# Patient Record
Sex: Female | Born: 1990 | Race: Black or African American | Hispanic: No | Marital: Married | Smoking: Never smoker
Health system: Southern US, Community
[De-identification: ages and names within clinical notes are randomized; demographics above are authoritative.]

## PROBLEM LIST (undated history)

## (undated) DIAGNOSIS — K219 Gastro-esophageal reflux disease without esophagitis: Secondary | ICD-10-CM

## (undated) DIAGNOSIS — F32A Depression, unspecified: Secondary | ICD-10-CM

## (undated) DIAGNOSIS — F329 Major depressive disorder, single episode, unspecified: Secondary | ICD-10-CM

## (undated) HISTORY — PX: TONSILLECTOMY: SUR1361

---
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
## 2009-04-22 ENCOUNTER — Emergency Department (HOSPITAL_COMMUNITY): Admission: EM | Admit: 2009-04-22 | Discharge: 2009-04-22 | Payer: Self-pay | Admitting: Emergency Medicine

## 2009-05-29 ENCOUNTER — Emergency Department (HOSPITAL_COMMUNITY): Admission: EM | Admit: 2009-05-29 | Discharge: 2009-05-29 | Payer: Self-pay | Admitting: Emergency Medicine

## 2010-06-26 LAB — BASIC METABOLIC PANEL
BUN: 8 mg/dL (ref 6–23)
Chloride: 111 mEq/L (ref 96–112)
GFR calc Af Amer: >60 mL/min (ref >60)
Glucose, Bld: 111 mg/dL — ABNORMAL HIGH (ref 70–99)

## 2010-06-26 LAB — URINALYSIS, ROUTINE W REFLEX MICROSCOPIC
Bilirubin Urine: NEGATIVE
Glucose, UA: NEGATIVE mg/dL
Ketones, ur: 15 mg/dL — AB
Leukocytes, UA: NEGATIVE
Nitrite: NEGATIVE
pH: 5.5 (ref 5.0–8.0)

## 2010-09-06 ENCOUNTER — Emergency Department (HOSPITAL_COMMUNITY)
Admission: EM | Admit: 2010-09-06 | Discharge: 2010-09-06 | Disposition: A | Discharge status: Home / Self Care | Payer: Medicaid Other | Attending: Emergency Medicine | Admitting: Emergency Medicine

## 2010-09-06 DIAGNOSIS — S80869A Insect bite (nonvenomous), unspecified lower leg, initial encounter: Secondary | ICD-10-CM | POA: Insufficient documentation

## 2010-09-06 DIAGNOSIS — L089 Local infection of the skin and subcutaneous tissue, unspecified: Secondary | ICD-10-CM | POA: Insufficient documentation

## 2010-09-06 DIAGNOSIS — Y998 Other external cause status: Secondary | ICD-10-CM | POA: Insufficient documentation

## 2010-11-29 ENCOUNTER — Encounter: Payer: Self-pay | Admitting: *Deleted

## 2010-11-29 ENCOUNTER — Emergency Department (HOSPITAL_COMMUNITY)
Admission: EM | Admit: 2010-11-29 | Discharge: 2010-11-29 | Disposition: A | Discharge status: Home / Self Care | Payer: Medicaid Other | Attending: Emergency Medicine | Admitting: Emergency Medicine

## 2010-11-29 DIAGNOSIS — IMO0001 Reserved for inherently not codable concepts without codable children: Secondary | ICD-10-CM | POA: Insufficient documentation

## 2010-11-29 DIAGNOSIS — W57XXXA Bitten or stung by nonvenomous insect and other nonvenomous arthropods, initial encounter: Secondary | ICD-10-CM

## 2010-11-29 MED ORDER — DOXYCYCLINE HYCLATE 100 MG PO CAPS: 100.0000 mg | ORAL_CAPSULE | Freq: Two times a day (BID) | ORAL | Status: AC

## 2010-11-29 NOTE — ED Notes (Signed)
Pt reports multiple tick bites over the past week, red areas noted to left thigh, rt wrist and abd

## 2010-11-29 NOTE — ED Notes (Signed)
Pt a/ox4. Resp even and unlabored. NAD at this time. D/C instructions reviewed with pt. Pt verbalized understanding. Pt ambulated with steady gate.  

## 2010-11-29 NOTE — ED Provider Notes (Signed)
History     CSN: [id number] Arrival date & time: 11/29/2010  8:30 PM  Chief Complaint  Patient presents with  . Insect Bite   Patient is a 20 y.o. female presenting with rash. The history is provided by the patient.  Rash  This is a new problem. The current episode started more than 2 days ago. The problem has been gradually improving. Associated with: she has removed several small deer ticks from her legs and abdomen the past week,  with one persistent red and tender area on her left upper thigh. There has been no fever. The rash is present on the left upper leg. The pain is at a severity of 2/10. The pain is mild. The pain has been constant since onset. Associated symptoms include pain. Associated symptoms comments: She has redness surrounding the tick bite site. . She has tried nothing for the symptoms.    History reviewed. No pertinent past medical history.  Past Surgical History  Procedure Date  . Tonsillectomy     No family history on file.  History  Substance Use Topics  . Smoking status: Never Smoker   . Smokeless tobacco: Not on file  . Alcohol Use: No    OB History    Grav Para Term Preterm Abortions TAB SAB Ect Mult Living                  Review of Systems  Constitutional: Negative for fever, diaphoresis and fatigue.  HENT: Negative for congestion, sore throat and neck pain.   Eyes: Negative.   Respiratory: Negative for chest tightness and shortness of breath.   Cardiovascular: Negative for chest pain.  Gastrointestinal: Negative for nausea and abdominal pain.  Genitourinary: Negative.   Musculoskeletal: Negative for joint swelling and arthralgias.  Skin: Positive for color change and wound.  Neurological: Negative for dizziness, weakness, light-headedness, numbness and headaches.  Hematological: Negative.   Psychiatric/Behavioral: Negative.     Physical Exam  BP 100/61  Pulse 89  Temp(Src) 98.9 F (37.2 C) (Oral)  Resp 16  Ht 5' (1.524 m)  Wt 110  lb (49.896 kg)  BMI 21.48 kg/m2  SpO2 100%  LMP 11/22/2010  Physical Exam  Nursing note and vitals reviewed. Constitutional: She is oriented to person, place, and time. She appears well-developed and well-nourished.  HENT:  Head: Normocephalic and atraumatic.  Eyes: Conjunctivae are normal.  Neck: Normal range of motion.  Cardiovascular: Normal rate, regular rhythm, normal heart sounds and intact distal pulses.   Pulmonary/Chest: Effort normal and breath sounds normal. She has no wheezes.  Abdominal: Soft. Bowel sounds are normal. There is no tenderness.  Musculoskeletal: Normal range of motion.  Neurological: She is alert and oriented to person, place, and time.  Skin: Skin is warm and dry. Rash noted. Rash is macular.     Psychiatric: She has a normal mood and affect.    ED Course  Procedures  MDM Probable local tick bite reaction,  But with multiple tick exposures,  Will prophylax with doxycycline.      Candis Musa, PA 11/29/10 2354

## 2010-11-30 NOTE — ED Provider Notes (Signed)
History/physical exam/procedure(s) were performed by non-physician practitioner and as supervising physician I was immediately available for consultation/collaboration. I have reviewed all notes and am in agreement with care and plan.   Eulis Salazar S Gaila Engebretsen, MD 11/30/10 1553 

## 2011-01-13 LAB — DIFFERENTIAL
Basophils Absolute: 0
Basophils Relative: 0
Eosinophils Absolute: 0.1 — ABNORMAL LOW
Neutrophils Relative %: 74 — ABNORMAL HIGH

## 2011-01-13 LAB — PREGNANCY, URINE: Preg Test, Ur: NEGATIVE

## 2011-01-13 LAB — CBC
HCT: 34.4 — ABNORMAL LOW
Hemoglobin: 11.2 — ABNORMAL LOW
MCHC: 32.6
MCV: 71.7 — ABNORMAL LOW
Platelets: 106 — ABNORMAL LOW
RBC: 4.8
RDW: 15.1

## 2011-01-13 LAB — COMPREHENSIVE METABOLIC PANEL
ALT: 14
AST: 19
Albumin: 3.4 — ABNORMAL LOW
CO2: 23
Calcium: 8.7
Creatinine, Ser: 0.89
Glucose, Bld: 101 — ABNORMAL HIGH
Total Protein: 5.7 — ABNORMAL LOW

## 2011-01-13 LAB — URINALYSIS, ROUTINE W REFLEX MICROSCOPIC
Bilirubin Urine: NEGATIVE
Glucose, UA: NEGATIVE
Hgb urine dipstick: NEGATIVE
Ketones, ur: 15 — AB
Nitrite: NEGATIVE
pH: 6

## 2011-01-26 ENCOUNTER — Encounter (HOSPITAL_COMMUNITY): Payer: Self-pay | Admitting: Emergency Medicine

## 2011-01-26 ENCOUNTER — Emergency Department (HOSPITAL_COMMUNITY)
Admission: EM | Admit: 2011-01-26 | Discharge: 2011-01-26 | Disposition: A | Discharge status: Home / Self Care | Payer: Medicaid Other | Attending: Emergency Medicine | Admitting: Emergency Medicine

## 2011-01-26 DIAGNOSIS — N760 Acute vaginitis: Secondary | ICD-10-CM | POA: Insufficient documentation

## 2011-01-26 DIAGNOSIS — A499 Bacterial infection, unspecified: Secondary | ICD-10-CM | POA: Insufficient documentation

## 2011-01-26 DIAGNOSIS — B9689 Other specified bacterial agents as the cause of diseases classified elsewhere: Secondary | ICD-10-CM | POA: Insufficient documentation

## 2011-01-26 LAB — WET PREP, GENITAL: Yeast Wet Prep HPF POC: NONE SEEN

## 2011-01-26 LAB — RPR: RPR Ser Ql: NONREACTIVE

## 2011-01-26 LAB — URINALYSIS, ROUTINE W REFLEX MICROSCOPIC
Hgb urine dipstick: NEGATIVE
Protein, ur: NEGATIVE mg/dL
Specific Gravity, Urine: 1.025 (ref 1.005–1.030)
Urobilinogen, UA: 0.2 mg/dL (ref 0.0–1.0)

## 2011-01-26 LAB — PREGNANCY, URINE: Preg Test, Ur: NEGATIVE

## 2011-01-26 LAB — URINE MICROSCOPIC-ADD ON

## 2011-01-26 MED ORDER — FLUCONAZOLE 100 MG PO TABS
150.0000 mg | ORAL_TABLET | Freq: Once | ORAL | Status: AC
  Administered 2011-01-26: 150 mg via ORAL
  Filled 2011-01-26: qty 2

## 2011-01-26 MED ORDER — METRONIDAZOLE 500 MG PO TABS: 500.0000 mg | ORAL_TABLET | Freq: Two times a day (BID) | ORAL | Status: AC

## 2011-01-26 NOTE — ED Notes (Addendum)
Patient c/o vaginal discharge that started last night. Patient states "I had been on my period for a month now with my birth control and I just came off. I haven't had sex for the whole month. I just started to itch down there and now I have discharge that's so bad coming out in clumps." Patient denies odor reports discharge as thick white. Patient also reports having recent allergic reactions to birth control pills.

## 2011-01-27 LAB — GC/CHLAMYDIA PROBE AMP, GENITAL: Chlamydia, DNA Probe: NEGATIVE

## 2011-01-31 NOTE — ED Provider Notes (Signed)
History     CSN: [id number] Arrival date & time: 01/26/2011  3:22 PM   First MD Initiated Contact with Patient 01/26/11 1540      Chief Complaint  Patient presents with  . Vaginal Discharge    (Consider location/radiation/quality/duration/timing/severity/associated sxs/prior treatment) HPI Comments: She presents with thick,  White vaginal discharge and vaginal itching which started yesterday.  She does report having a 3 week long mense,  Which she states is her normal cycle since being placed on Depo provera last year.  She has not been sexually active in over 1 month.  Patient is a 20 y.o. female presenting with vaginal discharge. The history is provided by the patient.  Vaginal Discharge This is a new problem. The current episode started yesterday. The problem occurs constantly. The problem has been gradually worsening. Pertinent negatives include no abdominal pain, arthralgias, chest pain, congestion, fever, headaches, joint swelling, nausea, neck pain, numbness, rash, sore throat, swollen glands, vomiting or weakness. The symptoms are aggravated by nothing. She has tried nothing for the symptoms.    History reviewed. No pertinent past medical history.  Past Surgical History  Procedure Date  . Tonsillectomy     History reviewed. No pertinent family history.  History  Substance Use Topics  . Smoking status: Never Smoker   . Smokeless tobacco: Never Used  . Alcohol Use: No    OB History    Grav Para Term Preterm Abortions TAB SAB Ect Mult Living   1 1 1       1       Review of Systems  Constitutional: Negative for fever.  HENT: Negative for congestion, sore throat and neck pain.   Eyes: Negative.   Respiratory: Negative for chest tightness and shortness of breath.   Cardiovascular: Negative for chest pain.  Gastrointestinal: Negative for nausea, vomiting and abdominal pain.  Genitourinary: Positive for vaginal discharge. Negative for dysuria, frequency, hematuria,  flank pain, genital sores and pelvic pain.  Musculoskeletal: Negative for joint swelling and arthralgias.  Skin: Negative.  Negative for rash and wound.  Neurological: Negative for dizziness, weakness, light-headedness, numbness and headaches.  Hematological: Negative.   Psychiatric/Behavioral: Negative.     Allergies  Bee venom  Home Medications   Current Outpatient Rx  Name Route Sig Dispense Refill  . ETONOGESTREL 68 MG [place] IMPL Subcutaneous Inject 1 each into the skin once. AUGUST 2010     . METRONIDAZOLE 500 MG PO TABS Oral Take 1 tablet (500 mg total) by mouth 2 (two) times daily. 14 tablet 0    BP 97/60  Pulse 82  Temp(Src) 98.4 F (36.9 C) (Oral)  Resp 16  Ht 5\' 2"  (1.575 m)  Wt 106 lb 9.6 oz (48.353 kg)  BMI 19.50 kg/m2  SpO2 100%  LMP 01/03/2011  Physical Exam  Nursing note and vitals reviewed. Constitutional: She is oriented to person, place, and time. She appears well-developed and well-nourished.  HENT:  Head: Normocephalic and atraumatic.  Eyes: Conjunctivae are normal.  Neck: Normal range of motion.  Cardiovascular: Normal rate, regular rhythm, normal heart sounds and intact distal pulses.   Pulmonary/Chest: Effort normal and breath sounds normal. She has no wheezes.  Abdominal: Soft. Bowel sounds are normal. There is no tenderness.  Genitourinary: Uterus normal. There is no rash on the right labia. There is no rash on the left labia. Cervix exhibits no motion tenderness and no discharge. Right adnexum displays no mass and no tenderness. Left adnexum displays no mass and no tenderness.  There is erythema around the vagina. Vaginal discharge found.       Os closed.  Musculoskeletal: Normal range of motion.  Neurological: She is alert and oriented to person, place, and time.  Skin: Skin is warm and dry.  Psychiatric: She has a normal mood and affect.    ED Course  Procedures (including critical care time)   Results for orders placed during the hospital  encounter of 01/26/11  URINALYSIS, ROUTINE W REFLEX MICROSCOPIC      Component Value Range   Color, Urine YELLOW  YELLOW    Appearance HAZY (*) CLEAR    Specific Gravity, Urine 1.025  1.005 - 1.030    pH 7.0  5.0 - 8.0    Glucose, UA NEGATIVE  NEGATIVE (mg/dL)   Hgb urine dipstick NEGATIVE  NEGATIVE    Bilirubin Urine NEGATIVE  NEGATIVE    Ketones, ur NEGATIVE  NEGATIVE (mg/dL)   Protein, ur NEGATIVE  NEGATIVE (mg/dL)   Urobilinogen, UA 0.2  0.0 - 1.0 (mg/dL)   Nitrite NEGATIVE  NEGATIVE    Leukocytes, UA MODERATE (*) NEGATIVE   GC/CHLAMYDIA PROBE AMP, GENITAL      Component Value Range   GC Probe Amp, Genital NEGATIVE  NEGATIVE    Chlamydia, DNA Probe NEGATIVE  NEGATIVE   RPR      Component Value Range   RPR NON REACTIVE  NON REACTIVE   WET PREP, GENITAL      Component Value Range   Yeast, Wet Prep NONE SEEN  NONE SEEN    Trich, Wet Prep NONE SEEN  NONE SEEN    Clue Cells, Wet Prep MANY (*) NONE SEEN    WBC, Wet Prep HPF POC MANY (*) NONE SEEN   PREGNANCY, URINE      Component Value Range   Preg Test, Ur NEGATIVE    URINE MICROSCOPIC-ADD ON      Component Value Range   Squamous Epithelial / LPF FEW (*) RARE    WBC, UA 7-10  <3 (WBC/hpf)   RBC / HPF 3-6  <3 (RBC/hpf)   Bacteria, UA MANY (*) RARE    No results found.     1. Bacterial vaginosis       MDM  Suspect leuk est on UA vaginal contamination given no sx of dysuria.  Treated for bv with flagyl.  Also,  Given white,  Clumpy and copious vaginal dc,  Also covered for yeast,  As exam most consistent with yeast vaginitis.  Diflucan 150mg  PO x 1.  Flagyl prescription.  Patients labs and/or radiological studies were reviewed during the medical decision making and disposition process.         Candis Musa, PA 01/31/11 2123

## 2011-02-04 NOTE — ED Provider Notes (Signed)
Medical screening examination/treatment/procedure(s) were performed by non-physician practitioner and as supervising physician I was immediately available for consultation/collaboration.  Donnetta Hutching, MD 02/04/11 [phone]

## 2011-02-15 ENCOUNTER — Emergency Department (HOSPITAL_COMMUNITY)
Admission: EM | Admit: 2011-02-15 | Discharge: 2011-02-15 | Disposition: A | Discharge status: Home / Self Care | Payer: Medicaid Other | Attending: Emergency Medicine | Admitting: Emergency Medicine

## 2011-02-15 ENCOUNTER — Encounter (HOSPITAL_COMMUNITY): Payer: Self-pay | Admitting: Emergency Medicine

## 2011-02-15 DIAGNOSIS — R51 Headache: Secondary | ICD-10-CM | POA: Insufficient documentation

## 2011-02-15 DIAGNOSIS — R112 Nausea with vomiting, unspecified: Secondary | ICD-10-CM | POA: Insufficient documentation

## 2011-02-15 LAB — CBC
HCT: 35.3 % — ABNORMAL LOW (ref 36.0–46.0)
Hemoglobin: 11.5 g/dL — ABNORMAL LOW (ref 12.0–15.0)
MCH: 23.3 pg — ABNORMAL LOW (ref 26.0–34.0)
MCHC: 32.6 g/dL (ref 30.0–36.0)
MCV: 71.6 fL — ABNORMAL LOW (ref 78.0–100.0)
RBC: 4.93 MIL/uL (ref 3.87–5.11)

## 2011-02-15 LAB — URINALYSIS, ROUTINE W REFLEX MICROSCOPIC
Bilirubin Urine: NEGATIVE
Ketones, ur: NEGATIVE mg/dL
Nitrite: NEGATIVE
Specific Gravity, Urine: 1.025 (ref 1.005–1.030)
Urobilinogen, UA: 1 mg/dL (ref 0.0–1.0)

## 2011-02-15 LAB — COMPREHENSIVE METABOLIC PANEL
Alkaline Phosphatase: 63 U/L (ref 39–117)
BUN: 11 mg/dL (ref 6–23)
Creatinine, Ser: 0.8 mg/dL (ref 0.50–1.10)
GFR calc Af Amer: >90 mL/min (ref >90)
Glucose, Bld: 84 mg/dL (ref 70–99)
Potassium: 3.5 mEq/L (ref 3.5–5.1)
Total Bilirubin: 1.5 mg/dL — ABNORMAL HIGH (ref 0.3–1.2)
Total Protein: 6.8 g/dL (ref 6.0–8.3)

## 2011-02-15 LAB — DIFFERENTIAL
Eosinophils Absolute: 0 10*3/uL (ref 0.0–0.7)
Lymphs Abs: 0.6 10*3/uL — ABNORMAL LOW (ref 0.7–4.0)
Monocytes Absolute: 0.2 10*3/uL (ref 0.1–1.0)
Monocytes Relative: 4 % (ref 3–12)
Neutrophils Relative %: 84 % — ABNORMAL HIGH (ref 43–77)

## 2011-02-15 LAB — LIPASE, BLOOD: Lipase: 14 U/L (ref 11–59)

## 2011-02-15 MED ORDER — MORPHINE SULFATE 4 MG/ML IJ SOLN
4.0000 mg | Freq: Once | INTRAMUSCULAR | Status: AC
  Administered 2011-02-15: 4 mg via INTRAVENOUS
  Filled 2011-02-15: qty 1

## 2011-02-15 MED ORDER — GI COCKTAIL ~~LOC~~
30.0000 mL | Freq: Once | ORAL | Status: AC
  Administered 2011-02-15: 30 mL via ORAL
  Filled 2011-02-15: qty 30

## 2011-02-15 MED ORDER — ONDANSETRON HCL 4 MG/2ML IJ SOLN
4.0000 mg | Freq: Once | INTRAMUSCULAR | Status: AC
  Administered 2011-02-15: 4 mg via INTRAVENOUS
  Filled 2011-02-15: qty 2

## 2011-02-15 MED ORDER — ONDANSETRON HCL 4 MG PO TABS: 4.0000 mg | ORAL_TABLET | Freq: Three times a day (TID) | ORAL | Status: AC | PRN

## 2011-02-15 MED ORDER — SODIUM CHLORIDE 0.9 % IV SOLN
999.0000 mL | Freq: Once | INTRAVENOUS | Status: AC
  Administered 2011-02-15: 1000 mL via INTRAVENOUS

## 2011-02-15 NOTE — ED Notes (Signed)
Abd pain all over started last night. N/v x 6 this am. Headache. Denies diarrhea. Last normal bm x 1 week ago. Pt moaning. Denies cough/congestion. C/o burning with urination x 1 week. Denies vag d/c.

## 2011-02-15 NOTE — ED Notes (Signed)
Pt was given a warm blanket

## 2011-02-15 NOTE — ED Provider Notes (Signed)
Scribed for Gerhard Munch, MD, the patient was seen in room [facility]. This chart was scribed by [employer]. The patient's care started at 09:05  CSN: [id number] Arrival date & time: 02/15/2011  8:56 AM   First MD Initiated Contact with Patient 02/15/11 [phone]      Chief Complaint  Patient presents with  . Nausea  . Emesis  . Headache   HPI Tammy Oliver is a 20 y.o. female who presents to the Emergency Department complaining of Nausea with associated vomiting, abdominal cramping and headache. Reports that yesterday she had low grade irritation of her stomach and vomitted 6 times this morning at about 6am. Patient denies any aggravating or alleviating factors. Reports associated 100 fever. Patient denies any significant history of prior illness. Denies chest pain, difficulty breathing, swelling in her legs, vaginal discharge or pain. Patient reports dysuria. Patient is currently on her menstrual period.  History reviewed. No pertinent past medical history.  Past Surgical History  Procedure Date  . Tonsillectomy     History reviewed. No pertinent family history.  History  Substance Use Topics  . Smoking status: Never Smoker   . Smokeless tobacco: Never Used  . Alcohol Use: No    OB History    Grav Para Term Preterm Abortions TAB SAB Ect Mult Living   1 1 1       1       Review of Systems  Constitutional: Negative for fever.       10 Systems reviewed and are negative for acute change except as noted in the HPI.  HENT: Negative for rhinorrhea.   Eyes: Negative for discharge and redness.  Respiratory: Negative for cough and shortness of breath.   Cardiovascular: Negative for chest pain.  Gastrointestinal: Positive for nausea, vomiting and abdominal pain. Negative for diarrhea.  Genitourinary: Positive for dysuria. Negative for vaginal discharge.       Currently on Menstrual period  Musculoskeletal: Negative for back pain.  Skin: Negative for rash.  Neurological:  Negative for dizziness, syncope, speech difficulty, weakness, numbness and headaches.  Psychiatric/Behavioral: Negative for suicidal ideas, hallucinations and confusion.  All other systems reviewed and are negative.    Allergies  Bee venom  Home Medications   Current Outpatient Rx  Name Route Sig Dispense Refill  . ETONOGESTREL 68 MG [place] IMPL Subcutaneous Inject 1 each into the skin once. AUGUST 2010       BP 99/72  Pulse 77  Temp(Src) 98.5 F (36.9 C) (Oral)  Resp 18  SpO2 98%  LMP 02/15/2011  Physical Exam  Nursing note and vitals reviewed. Constitutional: She is oriented to person, place, and time. She appears well-developed and well-nourished. No distress.  HENT:  Head: Normocephalic and atraumatic.  Right Ear: External ear normal.  Left Ear: External ear normal.  Mouth/Throat: Oropharynx is clear and moist. No oropharyngeal exudate.  Eyes: Conjunctivae are normal. Right eye exhibits no discharge. Left eye exhibits no discharge. No scleral icterus.  Neck: Neck supple. No tracheal deviation present.  Cardiovascular: Normal rate and regular rhythm.   No murmur heard. Pulmonary/Chest: Effort normal and breath sounds normal. No stridor. No respiratory distress. She has no wheezes. She has no rales.  Abdominal: Soft. Bowel sounds are normal. She exhibits no distension. There is tenderness (Diffuse). There is no guarding.  Musculoskeletal: She exhibits no edema and no tenderness.  Neurological: She is alert and oriented to person, place, and time. No cranial nerve deficit.  Skin: Skin is warm and dry.  No rash noted. No erythema.  Psychiatric: She has a normal mood and affect. Her behavior is normal.    ED Course  Procedures   DIAGNOSTIC STUDIES: Oxygen Saturation is 98% on room air, normal by my interpretation.    COORDINATION OF CARE: 09:10 - EDP examined patient at bedside and ordered the following Orders Placed This Encounter  Procedures  . CBC  . Differential    . Comprehensive metabolic panel  . Lipase, blood  . Urinalysis with microscopic  . Pregnancy, urine POC    Results for orders placed during the hospital encounter of 02/15/11  CBC      Component Value Range   WBC 5.0  4.0 - 10.5 (K/uL)   RBC 4.93  3.87 - 5.11 (MIL/uL)   Hemoglobin 11.5 (*) 12.0 - 15.0 (g/dL)   HCT 16.1 (*) 09.6 - 46.0 (%)   MCV 71.6 (*) 78.0 - 100.0 (fL)   MCH 23.3 (*) 26.0 - 34.0 (pg)   MCHC 32.6  30.0 - 36.0 (g/dL)   RDW 04.5  40.9 - 81.1 (%)   Platelets 95 (*) 150 - 400 (K/uL)  DIFFERENTIAL      Component Value Range   Neutrophils Relative 84 (*) 43 - 77 (%)   Neutro Abs 4.2  1.7 - 7.7 (K/uL)   Lymphocytes Relative 12  12 - 46 (%)   Lymphs Abs 0.6 (*) 0.7 - 4.0 (K/uL)   Monocytes Relative 4  3 - 12 (%)   Monocytes Absolute 0.2  0.1 - 1.0 (K/uL)   Eosinophils Relative 0  0 - 5 (%)   Eosinophils Absolute 0.0  0.0 - 0.7 (K/uL)   Basophils Relative 0  0 - 1 (%)   Basophils Absolute 0.0  0.0 - 0.1 (K/uL)  COMPREHENSIVE METABOLIC PANEL      Component Value Range   Sodium 138  135 - 145 (mEq/L)   Potassium 3.5  3.5 - 5.1 (mEq/L)   Chloride 107  96 - 112 (mEq/L)   CO2 24  19 - 32 (mEq/L)   Glucose, Bld 84  70 - 99 (mg/dL)   BUN 11  6 - 23 (mg/dL)   Creatinine, Ser 9.14  0.50 - 1.10 (mg/dL)   Calcium 8.7  8.4 - 78.2 (mg/dL)   Total Protein 6.8  6.0 - 8.3 (g/dL)   Albumin 3.8  3.5 - 5.2 (g/dL)   AST 16  0 - 37 (U/L)   ALT 12  0 - 35 (U/L)   Alkaline Phosphatase 63  39 - 117 (U/L)   Total Bilirubin 1.5 (*) 0.3 - 1.2 (mg/dL)   GFR calc non Af Amer >90  >90 (mL/min)   GFR calc Af Amer >90  >90 (mL/min)  LIPASE, BLOOD      Component Value Range   Lipase 14  11 - 59 (U/L)  POCT PREGNANCY, URINE      Component Value Range   Preg Test, Ur NEGATIVE         MDM: This previously well young female presents with hours of nausea, vomiting, generalized discomfort. On initial exam the patient is in no distress though she is uncomfortable appearing. Physical  exam his reassuring. The patient's labs are notable for mild anemia, mild thrombocytopenia. Both of these conditions are consistent with her prior laboratory evaluation, and the patient is currently on her menstrual cycle.  Labs do not suggest ongoing significant infection, though there is slight elevation in the patient's bilirubin. (This is also consistent with prior  lab evaluation). On reevaluation following provision of medications, the patient was sleeping, seemingly comfortably. She'll be discharged with instructions for viral illness, and provided both return precautions with the consideration that this is an early manifestation of hepatobiliary dysfunction, and oral antiemetics   Scribe Attestation This patient was seen, and evaluated by me, documentation was provided with the assistance of a scribe    Gerhard Munch, MD 02/15/11 1051

## 2011-02-15 NOTE — ED Notes (Signed)
Pt talking with RN at this time. Pt has family at bedside. NAD notes at this time.

## 2011-11-02 ENCOUNTER — Encounter (HOSPITAL_COMMUNITY): Payer: Self-pay | Admitting: Emergency Medicine

## 2011-11-02 ENCOUNTER — Emergency Department (HOSPITAL_COMMUNITY): Payer: Medicaid Other

## 2011-11-02 ENCOUNTER — Emergency Department (HOSPITAL_COMMUNITY)
Admission: EM | Admit: 2011-11-02 | Discharge: 2011-11-02 | Disposition: A | Discharge status: Home / Self Care | Payer: Medicaid Other | Attending: Emergency Medicine | Admitting: Emergency Medicine

## 2011-11-02 DIAGNOSIS — D649 Anemia, unspecified: Secondary | ICD-10-CM

## 2011-11-02 DIAGNOSIS — K5900 Constipation, unspecified: Secondary | ICD-10-CM

## 2011-11-02 DIAGNOSIS — R109 Unspecified abdominal pain: Secondary | ICD-10-CM | POA: Insufficient documentation

## 2011-11-02 DIAGNOSIS — K602 Anal fissure, unspecified: Secondary | ICD-10-CM

## 2011-11-02 LAB — COMPREHENSIVE METABOLIC PANEL
AST: 19 U/L (ref 0–37)
BUN: 12 mg/dL (ref 6–23)
CO2: 24 mEq/L (ref 19–32)
Chloride: 102 mEq/L (ref 96–112)
Creatinine, Ser: 0.83 mg/dL (ref 0.50–1.10)
GFR calc non Af Amer: >90 mL/min (ref >90)
Total Bilirubin: 0.6 mg/dL (ref 0.3–1.2)

## 2011-11-02 LAB — URINALYSIS, ROUTINE W REFLEX MICROSCOPIC
Glucose, UA: NEGATIVE mg/dL
Ketones, ur: NEGATIVE mg/dL
Leukocytes, UA: NEGATIVE
Protein, ur: NEGATIVE mg/dL

## 2011-11-02 LAB — CBC WITH DIFFERENTIAL/PLATELET
Eosinophils Relative: 3 % (ref 0–5)
HCT: 30.7 % — ABNORMAL LOW (ref 36.0–46.0)
Lymphocytes Relative: 52 % — ABNORMAL HIGH (ref 12–46)
Lymphs Abs: 2.8 10*3/uL (ref 0.7–4.0)
MCV: 70.4 fL — ABNORMAL LOW (ref 78.0–100.0)
Monocytes Absolute: 0.3 10*3/uL (ref 0.1–1.0)
Monocytes Relative: 5 % (ref 3–12)
RBC: 4.36 MIL/uL (ref 3.87–5.11)
WBC: 5.3 10*3/uL (ref 4.0–10.5)

## 2011-11-02 MED ORDER — HYDROCORTISONE 2.5 % RE CREA: TOPICAL_CREAM | RECTAL | Status: AC

## 2011-11-02 MED ORDER — SODIUM CHLORIDE 0.9 % IV BOLUS (SEPSIS)
500.0000 mL | Freq: Once | INTRAVENOUS | Status: AC
  Administered 2011-11-02: 500 mL via INTRAVENOUS

## 2011-11-02 MED ORDER — PANTOPRAZOLE SODIUM 40 MG IV SOLR
40.0000 mg | Freq: Once | INTRAVENOUS | Status: AC
  Administered 2011-11-02: 40 mg via INTRAVENOUS
  Filled 2011-11-02: qty 40

## 2011-11-02 MED ORDER — IOHEXOL 300 MG/ML  SOLN
100.0000 mL | Freq: Once | INTRAMUSCULAR | Status: AC | PRN
  Administered 2011-11-02: 100 mL via INTRAVENOUS

## 2011-11-02 MED ORDER — IOHEXOL 300 MG/ML  SOLN
40.0000 mL | Freq: Once | INTRAMUSCULAR | Status: AC | PRN
  Administered 2011-11-02: 40 mL via ORAL

## 2011-11-02 NOTE — ED Provider Notes (Signed)
History     CSN: [id number]  Arrival date & time 11/02/11  4098   First MD Initiated Contact with Patient 11/02/11 [phone]      Chief Complaint  Patient presents with  . Rectal Bleeding    (Consider location/radiation/quality/duration/timing/severity/associated sxs/prior treatment) HPI Comments: Patient comes to emergency department with complaint of lower abdominal cramping, nausea, generalized weakness and rectal bleeding that began yesterday. She states that she feels a" burning" sensation and an intense urge to have a bowel movement. She states at the onset of her symptoms, she noticed after defecating that her stool were formed but white in color with a moderate amount of bright red blood in the toilet and on the tissue after wiping. She states that she had several episodes of this before her stool finally became brown in color but the bleeding continued with each bowel movement. She denies vomiting, fever, new medications or over-the-counter aspirin or anti-inflammatories.  Patient denies previous abdominal surgeries or history of similar symptoms. She is unsure of any pertinent family history  Patient is a 21 y.o. female presenting with hematochezia. The history is provided by the patient.  Rectal Bleeding  The current episode started yesterday. The onset was gradual. The problem occurs frequently. The problem has been unchanged. The pain is moderate. Stool description: formed. There was no prior successful therapy. There was no prior unsuccessful therapy. Associated symptoms include anorexia, abdominal pain, nausea and rectal pain. Pertinent negatives include no fever, no hematemesis, no hemorrhoids, no vomiting, no hematuria, no vaginal bleeding, no vaginal discharge, no chest pain, no headaches, no coughing, no difficulty breathing and no rash. She has been behaving normally. She has been eating less than usual. Urine output has been normal. Her past medical history does not include  abdominal surgery, inflammatory bowel disease, recent abdominal injury, recent antibiotic use, recent change in diet or a recent illness. There were no sick contacts. She has received no recent medical care.    History reviewed. No pertinent past medical history.  Past Surgical History  Procedure Date  . Tonsillectomy     History reviewed. No pertinent family history.  History  Substance Use Topics  . Smoking status: Never Smoker   . Smokeless tobacco: Never Used  . Alcohol Use: No    OB History    Grav Para Term Preterm Abortions TAB SAB Ect Mult Living   1 1 1       1       Review of Systems  Constitutional: Positive for appetite change. Negative for fever, chills and activity change.  Respiratory: Negative for cough.   Cardiovascular: Negative for chest pain.  Gastrointestinal: Positive for nausea, abdominal pain, blood in stool, hematochezia, rectal pain and anorexia. Negative for vomiting, constipation, abdominal distention, hematemesis and hemorrhoids.  Genitourinary: Negative for dysuria, hematuria, flank pain, vaginal bleeding, vaginal discharge and menstrual problem.  Skin: Negative for rash.  Neurological: Positive for weakness. Negative for dizziness, light-headedness, numbness and headaches.  Hematological: Negative for adenopathy.  Psychiatric/Behavioral: Negative for confusion.  All other systems reviewed and are negative.    Allergies  Bee venom  Home Medications   Current Outpatient Rx  Name Route Sig Dispense Refill  . ETONOGESTREL 68 MG [place] IMPL Subcutaneous Inject 1 each into the skin once. AUGUST 2010       BP 101/65  Pulse 81  Temp 98.2 F (36.8 C) (Oral)  Resp 16  Ht 5\' 2"  (1.575 m)  Wt 106 lb 8 oz (48.308 kg)  BMI 19.48 kg/m2  SpO2 100%  LMP 10/29/2011  Physical Exam  Nursing note and vitals reviewed. Constitutional: She is oriented to person, place, and time. She appears well-developed and well-nourished. No distress.  HENT:  Head:  Normocephalic and atraumatic.  Mouth/Throat: Oropharynx is clear and moist.  Cardiovascular: Normal rate, regular rhythm, normal heart sounds and intact distal pulses.   No murmur heard. Pulmonary/Chest: Effort normal and breath sounds normal. No respiratory distress.  Abdominal: Soft. Bowel sounds are normal. She exhibits no distension and no mass. There is no hepatosplenomegaly. There is tenderness in the right lower quadrant and left lower quadrant. There is no rigidity, no rebound, no guarding, no CVA tenderness and no tenderness at McBurney's point.  Genitourinary: Rectal exam shows fissure and tenderness. Rectal exam shows no external hemorrhoid, no mass and anal tone normal. Guaiac positive stool.       Small anal fissure with guaiac positive stool.  No external hemorrhoid, no palpable internal masses or hemorrhoids.  No active bleeding.  Stool was soft and light brown in color.  Musculoskeletal: She exhibits no edema.  Neurological: She is alert and oriented to person, place, and time. She exhibits normal muscle tone. Coordination normal.  Skin: Skin is warm and dry.    ED Course  Procedures (including critical care time)  Results for orders placed during the hospital encounter of 11/02/11  CBC WITH DIFFERENTIAL      Component Value Range   WBC 5.3  4.0 - 10.5 K/uL   RBC 4.36  3.87 - 5.11 MIL/uL   Hemoglobin 10.1 (*) 12.0 - 15.0 g/dL   HCT 45.4 (*) 09.8 - 11.9 %   MCV 70.4 (*) 78.0 - 100.0 fL   MCH 23.2 (*) 26.0 - 34.0 pg   MCHC 32.9  30.0 - 36.0 g/dL   RDW 14.7  82.9 - 56.2 %   Platelets 107 (*) 150 - 400 K/uL   Neutrophils Relative 39 (*) 43 - 77 %   Neutro Abs 2.1  1.7 - 7.7 K/uL   Lymphocytes Relative 52 (*) 12 - 46 %   Lymphs Abs 2.8  0.7 - 4.0 K/uL   Monocytes Relative 5  3 - 12 %   Monocytes Absolute 0.3  0.1 - 1.0 K/uL   Eosinophils Relative 3  0 - 5 %   Eosinophils Absolute 0.2  0.0 - 0.7 K/uL   Basophils Relative 1  0 - 1 %   Basophils Absolute 0.1  0.0 - 0.1  K/uL   WBC Morphology PLATELET COUNT CONFIRMED BY SMEAR    COMPREHENSIVE METABOLIC PANEL      Component Value Range   Sodium 133 (*) 135 - 145 mEq/L   Potassium 3.0 (*) 3.5 - 5.1 mEq/L   Chloride 102  96 - 112 mEq/L   CO2 24  19 - 32 mEq/L   Glucose, Bld 76  70 - 99 mg/dL   BUN 12  6 - 23 mg/dL   Creatinine, Ser 1.30  0.50 - 1.10 mg/dL   Calcium 8.9  8.4 - 86.5 mg/dL   Total Protein 6.7  6.0 - 8.3 g/dL   Albumin 3.9  3.5 - 5.2 g/dL   AST 19  0 - 37 U/L   ALT 10  0 - 35 U/L   Alkaline Phosphatase 72  39 - 117 U/L   Total Bilirubin 0.6  0.3 - 1.2 mg/dL   GFR calc non Af Amer >90  >90 mL/min   GFR calc  Af Amer >90  >90 mL/min  LIPASE, BLOOD      Component Value Range   Lipase 18  11 - 59 U/L  URINALYSIS, ROUTINE W REFLEX MICROSCOPIC      Component Value Range   Color, Urine YELLOW  YELLOW   APPearance HAZY (*) CLEAR   Specific Gravity, Urine 1.020  1.005 - 1.030   pH 7.5  5.0 - 8.0   Glucose, UA NEGATIVE  NEGATIVE mg/dL   Hgb urine dipstick NEGATIVE  NEGATIVE   Bilirubin Urine NEGATIVE  NEGATIVE   Ketones, ur NEGATIVE  NEGATIVE mg/dL   Protein, ur NEGATIVE  NEGATIVE mg/dL   Urobilinogen, UA 0.2  0.0 - 1.0 mg/dL   Nitrite NEGATIVE  NEGATIVE   Leukocytes, UA NEGATIVE  NEGATIVE  PREGNANCY, URINE      Component Value Range   Preg Test, Ur NEGATIVE  NEGATIVE  OCCULT BLOOD, POC DEVICE      Component Value Range   Fecal Occult Bld POSITIVE       Ct Abdomen Pelvis W Contrast  11/02/2011  *RADIOLOGY REPORT*  Clinical Data: Rectal bleed, abdominal pain  CT ABDOMEN AND PELVIS WITH CONTRAST  Technique:  Multidetector CT imaging of the abdomen and pelvis was performed following the standard protocol during bolus administration of intravenous contrast.  Contrast: OMNIPAQUE IOHEXOL 300 MG/ML  SOLN  Comparison: None.  Findings: Sagittal images of the spine are unremarkable.  No destructive bony lesions are noted.  Lung bases are unremarkable.  Enhanced liver, spleen, pancreas and  adrenal glands are unremarkable.  Enhanced kidneys are symmetrical in size.  No hydronephrosis or hydroureter.  Significant stool noted in the right colon transverse colon and left colon.  No calcified gallstones are noted within gallbladder.  There is no aortic aneurysm.  No small bowel obstruction.  No ascites or free air.  No adenopathy.  No pericecal inflammation.  The terminal ileum is unremarkable.  Normal appendix is partially visualized in axial image 51.  Normal appendix partially visualized in coronal image 27.  Moderate distended urinary bladder is noted.  Some stool noted in the distal sigmoid colon and rectum.  Bilateral ovary is unremarkable.  No thickening of the rectal wall is identified.  No inguinal adenopathy.  No destructive bony lesions are noted within pelvis.  IMPRESSION:  1.  No acute inflammatory process within abdomen or pelvis. 2.  No hydronephrosis or hydroureter. 3.  Significant stool noted in the right colon, transverse colon and left colon.  No pericecal inflammation.  Normal appendix.  Some stool noted in the distal sigmoid colon and rectum.  No thickening of the rectal wall. 4.  Unremarkable uterus and adnexa.  Moderate distended urinary bladder.  Original Report Authenticated By: Natasha Mead, M.D.      MDM    Patient is feeling better.  Has received IVF's.  Vitals remains stable.  Non-toxic appearing.  abd remains soft, w/o guarding or peritoneal signs.  I have discussed care plan and results with EDP.  Pt also seen by EDP.     Rectal bleeding thought to be a result of anal fissure.  Large amt of stool throughout the colon.  Patient agrees to use OTC miralax for several days.  Patient is feeling better and appears stable for d/c.    I will also give her a referral for GI.  She agrees to return here if her sx's worsen.   The patient appears reasonably screened and/or stabilized for discharge and I doubt any  other medical condition or other [facility] requiring further screening,  evaluation, or treatment in the ED at this time prior to discharge.    Prescribed: anusol HC cream  Keir Foland L. [place], [place] 11/07/11 2139

## 2011-11-02 NOTE — ED Notes (Signed)
Patient reports x3 white stools prior to rectal bleeding and pressure/urgency to have BM to EDPA.

## 2011-11-02 NOTE — ED Notes (Signed)
Patient c/o rectal bleeding with abd cramping, nausea, weakness, and burning sensation with BMs. Per patient noticed bright blood yesterday in stool.

## 2011-11-12 NOTE — ED Provider Notes (Signed)
Medical screening examination/treatment/procedure(s) were conducted as a shared visit with non-physician practitioner(s) and myself.  I personally evaluated the patient during the encounter.  Patient is hemodynamically stable. No active bleeding in ED.   GI referral given  Donnetta Hutching, MD 11/12/11 1515

## 2012-03-19 ENCOUNTER — Emergency Department (HOSPITAL_COMMUNITY)
Admission: EM | Admit: 2012-03-19 | Discharge: 2012-03-20 | Disposition: A | Discharge status: Home / Self Care | Payer: Medicaid Other | Attending: Emergency Medicine | Admitting: Emergency Medicine

## 2012-03-19 ENCOUNTER — Encounter (HOSPITAL_COMMUNITY): Payer: Self-pay | Admitting: *Deleted

## 2012-03-19 DIAGNOSIS — R197 Diarrhea, unspecified: Secondary | ICD-10-CM | POA: Insufficient documentation

## 2012-03-19 DIAGNOSIS — R059 Cough, unspecified: Secondary | ICD-10-CM | POA: Insufficient documentation

## 2012-03-19 DIAGNOSIS — K5289 Other specified noninfective gastroenteritis and colitis: Secondary | ICD-10-CM | POA: Insufficient documentation

## 2012-03-19 DIAGNOSIS — J3489 Other specified disorders of nose and nasal sinuses: Secondary | ICD-10-CM | POA: Insufficient documentation

## 2012-03-19 DIAGNOSIS — R1084 Generalized abdominal pain: Secondary | ICD-10-CM | POA: Insufficient documentation

## 2012-03-19 DIAGNOSIS — R05 Cough: Secondary | ICD-10-CM | POA: Insufficient documentation

## 2012-03-19 DIAGNOSIS — IMO0001 Reserved for inherently not codable concepts without codable children: Secondary | ICD-10-CM | POA: Insufficient documentation

## 2012-03-19 DIAGNOSIS — Z9889 Other specified postprocedural states: Secondary | ICD-10-CM | POA: Insufficient documentation

## 2012-03-19 DIAGNOSIS — Z79899 Other long term (current) drug therapy: Secondary | ICD-10-CM | POA: Insufficient documentation

## 2012-03-19 DIAGNOSIS — K529 Noninfective gastroenteritis and colitis, unspecified: Secondary | ICD-10-CM

## 2012-03-19 LAB — BASIC METABOLIC PANEL
BUN: 13 mg/dL (ref 6–23)
Calcium: 8.5 mg/dL (ref 8.4–10.5)
Creatinine, Ser: 0.86 mg/dL (ref 0.50–1.10)
GFR calc non Af Amer: >90 mL/min (ref >90)
Glucose, Bld: 105 mg/dL — ABNORMAL HIGH (ref 70–99)

## 2012-03-19 LAB — CBC WITH DIFFERENTIAL/PLATELET
Eosinophils Absolute: 0 10*3/uL (ref 0.0–0.7)
Eosinophils Relative: 0 % (ref 0–5)
Hemoglobin: 12.3 g/dL (ref 12.0–15.0)
Lymphs Abs: 0.4 10*3/uL — ABNORMAL LOW (ref 0.7–4.0)
MCH: 23.7 pg — ABNORMAL LOW (ref 26.0–34.0)
MCV: 71.4 fL — ABNORMAL LOW (ref 78.0–100.0)
Monocytes Absolute: 0.3 10*3/uL (ref 0.1–1.0)
Monocytes Relative: 4 % (ref 3–12)
Platelets: 105 10*3/uL — ABNORMAL LOW (ref 150–400)
RBC: 5.18 MIL/uL — ABNORMAL HIGH (ref 3.87–5.11)

## 2012-03-19 MED ORDER — ONDANSETRON HCL 4 MG/2ML IJ SOLN
4.0000 mg | Freq: Once | INTRAMUSCULAR | Status: AC
  Administered 2012-03-20: 4 mg via INTRAVENOUS
  Filled 2012-03-19: qty 2

## 2012-03-19 MED ORDER — ONDANSETRON HCL 4 MG/2ML IJ SOLN
4.0000 mg | Freq: Once | INTRAMUSCULAR | Status: AC
Start: 2012-03-19 — End: 2012-03-19
  Administered 2012-03-19: 4 mg via INTRAVENOUS
  Filled 2012-03-19: qty 2

## 2012-03-19 MED ORDER — LOPERAMIDE HCL 2 MG PO CAPS: 2.0000 mg | ORAL_CAPSULE | Freq: Four times a day (QID) | ORAL | Status: DC | PRN

## 2012-03-19 MED ORDER — SODIUM CHLORIDE 0.9 % IV BOLUS (SEPSIS)
1000.0000 mL | Freq: Once | INTRAVENOUS | Status: AC
  Administered 2012-03-19: 1000 mL via INTRAVENOUS

## 2012-03-19 MED ORDER — HYDROMORPHONE HCL PF 1 MG/ML IJ SOLN
1.0000 mg | Freq: Once | INTRAMUSCULAR | Status: AC
  Administered 2012-03-19: 1 mg via INTRAVENOUS
  Filled 2012-03-19: qty 1

## 2012-03-19 MED ORDER — SODIUM CHLORIDE 0.9 % IV SOLN: INTRAVENOUS | Status: DC

## 2012-03-19 MED ORDER — PROMETHAZINE HCL 25 MG PO TABS: 25.0000 mg | ORAL_TABLET | Freq: Four times a day (QID) | ORAL | Status: DC | PRN

## 2012-03-19 NOTE — ED Notes (Signed)
States she has general body aches and pains, states she feels nauseated.  Resting quietly with head under blanket, very soft spoken.

## 2012-03-19 NOTE — ED Notes (Addendum)
Onset this am NVD, body aches and chills  Seen by MD today and given injection for vomiting.

## 2012-03-19 NOTE — ED Provider Notes (Signed)
History   This chart was scribed for Tammy Jakes, MD by Gerlean Ren, ED Scribe. This patient was seen in room APA08/APA08 and the patient's care was started at 10:09 PM    CSN: [id number]  Arrival date & time 03/19/12  2115   First MD Initiated Contact with Patient 03/19/12 2140      Chief Complaint  Patient presents with  . Emesis     The history is provided by the patient. No language interpreter was used.   Tammy Oliver is a 21 y.o. female with no chronic medical conditions who presents to the Emergency Department complaining of nausea and 10 episodes of non-bloody, non-bilious emesis with sudden onset at 10:00 AM this morning with 3 episodes of associated non-bloody diarrhea and mild diffuse abdominal tenderness.  Pt also reports myalgias, and residual cough, congestion, and rhinorrhea that began last week and have been gradually improving.  Pt denies known sick contacts.  Pt went to [facility] today and was told to report here if symptoms persisted.  Pt states she felt fine yesterday and denies fever, neck pain, back pain, dysuria, chest pain, swelling in legs, rash, HA, dyspnea, rash.  No h/o bleeding easily.  Pt reports menstrual cycle is irregular on Depo.  Pt denies tobacco and alcohol use.   History reviewed. No pertinent past medical history.  Past Surgical History  Procedure Date  . Tonsillectomy     History reviewed. No pertinent family history.  History  Substance Use Topics  . Smoking status: Never Smoker   . Smokeless tobacco: Never Used  . Alcohol Use: No    OB History    Grav Para Term Preterm Abortions TAB SAB Ect Mult Living   1 1 1       1       Review of Systems  Constitutional: Negative for fever.  HENT: Positive for congestion and rhinorrhea. Negative for sore throat and neck pain.   Respiratory: Positive for cough. Negative for shortness of breath.   Cardiovascular: Negative for chest pain and leg swelling.  Gastrointestinal:  Positive for nausea. Negative for blood in stool.  Genitourinary: Negative for dysuria.  Musculoskeletal: Negative for back pain.  Skin: Negative for rash.  Neurological: Negative for headaches.  Hematological: Does not bruise/bleed easily.  Psychiatric/Behavioral: Negative for confusion.    Allergies  Bee venom  Home Medications   Current Outpatient Rx  Name  Route  Sig  Dispense  Refill  . MEDROXYPROGESTERONE ACETATE 150 MG/ML IM SUSP   Intramuscular   Inject 150 mg into the muscle every 3 (three) months.         Marland Kitchen LOPERAMIDE HCL 2 MG PO CAPS   Oral   Take 1 capsule (2 mg total) by mouth 4 (four) times daily as needed for diarrhea or loose stools.   12 capsule   0   . PROMETHAZINE HCL 25 MG PO TABS   Oral   Take 1 tablet (25 mg total) by mouth every 6 (six) hours as needed for nausea.   12 tablet   0     BP 87/52  Pulse 95  Temp 98.7 F (37.1 C) (Oral)  Resp 20  Ht 5\' 2"  (1.575 m)  Wt 106 lb (48.081 kg)  BMI 19.39 kg/m2  SpO2 100%  Physical Exam  Nursing note and vitals reviewed. Constitutional: She is oriented to person, place, and time. She appears well-developed and well-nourished.  HENT:  Head: Normocephalic and atraumatic.  Eyes: Conjunctivae  normal and EOM are normal. No scleral icterus.  Neck: Normal range of motion. No tracheal deviation present.  Cardiovascular: Normal rate, regular rhythm and normal heart sounds.   No murmur heard. Pulmonary/Chest: Effort normal and breath sounds normal. She has no wheezes.  Abdominal: Soft. Bowel sounds are normal. There is no tenderness.  Musculoskeletal: Normal range of motion. She exhibits no edema.  Neurological: She is alert and oriented to person, place, and time. No cranial nerve deficit. Coordination normal.  Skin: Skin is warm. No rash noted.  Psychiatric: She has a normal mood and affect.    ED Course  Procedures (including critical care time) DIAGNOSTIC STUDIES: Oxygen Saturation is 100% on  room air, normal by my interpretation.    COORDINATION OF CARE: 10:14 PM- Patient informed of clinical course, understands medical decision-making process, and agrees with plan.  Ordered IV fluids, IV Zofran, IV dilaudid, CBC, and b-met.  Labs Reviewed  CBC WITH DIFFERENTIAL - Abnormal; Notable for the following:    RBC 5.18 (*)     MCV 71.4 (*)     MCH 23.7 (*)     Platelets 105 (*)     Neutrophils Relative 91 (*)     Lymphocytes Relative 5 (*)     Lymphs Abs 0.4 (*)     All other components within normal limits  BASIC METABOLIC PANEL - Abnormal; Notable for the following:    Glucose, Bld 105 (*)     All other components within normal limits   Results for orders placed during the hospital encounter of 03/19/12  CBC WITH DIFFERENTIAL      Component Value Range   WBC 7.9  4.0 - 10.5 K/uL   RBC 5.18 (*) 3.87 - 5.11 MIL/uL   Hemoglobin 12.3  12.0 - 15.0 g/dL   HCT 40.9  81.1 - 91.4 %   MCV 71.4 (*) 78.0 - 100.0 fL   MCH 23.7 (*) 26.0 - 34.0 pg   MCHC 33.2  30.0 - 36.0 g/dL   RDW 78.2  95.6 - 21.3 %   Platelets 105 (*) 150 - 400 K/uL   Neutrophils Relative 91 (*) 43 - 77 %   Neutro Abs 7.1  1.7 - 7.7 K/uL   Lymphocytes Relative 5 (*) 12 - 46 %   Lymphs Abs 0.4 (*) 0.7 - 4.0 K/uL   Monocytes Relative 4  3 - 12 %   Monocytes Absolute 0.3  0.1 - 1.0 K/uL   Eosinophils Relative 0  0 - 5 %   Eosinophils Absolute 0.0  0.0 - 0.7 K/uL   Basophils Relative 0  0 - 1 %   Basophils Absolute 0.0  0.0 - 0.1 K/uL  BASIC METABOLIC PANEL      Component Value Range   Sodium 140  135 - 145 mEq/L   Potassium 3.8  3.5 - 5.1 mEq/L   Chloride 109  96 - 112 mEq/L   CO2 22  19 - 32 mEq/L   Glucose, Bld 105 (*) 70 - 99 mg/dL   BUN 13  6 - 23 mg/dL   Creatinine, Ser 0.86  0.50 - 1.10 mg/dL   Calcium 8.5  8.4 - 57.8 mg/dL   GFR calc non Af Amer >90  >90 mL/min   GFR calc Af Amer >90  >90 mL/min    No results found.   1. Gastroenteritis       MDM  Suspect a viral gastroenteritis.  Patient improved with fluids and antinausea medicine  in the emergency department. We'll send her home with Imodium and Phenergan. Patient redosed with Zofran) 4 discharge. Abdomen is soft no significant tenderness. Do not suspect a significant intra-abdominal process.    I personally performed the services described in this documentation, which was scribed in my presence. The recorded information has been reviewed and is accurate.          Tammy Jakes, MD 03/19/12 [phone]

## 2012-03-20 NOTE — ED Notes (Signed)
Pt states she is feeling much better, no nausea, vomiting or diarrhea.

## 2012-11-20 ENCOUNTER — Emergency Department (HOSPITAL_COMMUNITY)
Admission: EM | Admit: 2012-11-20 | Discharge: 2012-11-20 | Disposition: A | Discharge status: Home / Self Care | Payer: Medicaid Other | Attending: Emergency Medicine | Admitting: Emergency Medicine

## 2012-11-20 ENCOUNTER — Emergency Department (HOSPITAL_COMMUNITY): Payer: Medicaid Other

## 2012-11-20 ENCOUNTER — Encounter (HOSPITAL_COMMUNITY): Payer: Self-pay | Admitting: Emergency Medicine

## 2012-11-20 DIAGNOSIS — Y929 Unspecified place or not applicable: Secondary | ICD-10-CM | POA: Insufficient documentation

## 2012-11-20 DIAGNOSIS — S8011XA Contusion of right lower leg, initial encounter: Secondary | ICD-10-CM

## 2012-11-20 DIAGNOSIS — S8010XA Contusion of unspecified lower leg, initial encounter: Secondary | ICD-10-CM | POA: Insufficient documentation

## 2012-11-20 DIAGNOSIS — Y9389 Activity, other specified: Secondary | ICD-10-CM | POA: Insufficient documentation

## 2012-11-20 DIAGNOSIS — W219XXA Striking against or struck by unspecified sports equipment, initial encounter: Secondary | ICD-10-CM | POA: Insufficient documentation

## 2012-11-20 MED ORDER — HYDROCODONE-ACETAMINOPHEN 5-325 MG PO TABS: ORAL_TABLET | ORAL | Status: DC

## 2012-11-20 MED ORDER — IBUPROFEN 600 MG PO TABS: 600.0000 mg | ORAL_TABLET | Freq: Four times a day (QID) | ORAL | Status: DC | PRN

## 2012-11-20 MED ORDER — HYDROCODONE-ACETAMINOPHEN 5-325 MG PO TABS
1.0000 | ORAL_TABLET | Freq: Once | ORAL | Status: AC
  Administered 2012-11-20: 1 via ORAL
  Filled 2012-11-20: qty 1
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
## 2012-11-23 NOTE — ED Provider Notes (Signed)
Medical screening examination/treatment/procedure(s) were performed by non-physician practitioner and as supervising physician I was immediately available for consultation/collaboration.   Shelda Jakes, MD 11/23/12 [phone]

## 2013-10-05 ENCOUNTER — Encounter (HOSPITAL_COMMUNITY): Payer: Self-pay | Admitting: Emergency Medicine

## 2013-10-05 ENCOUNTER — Emergency Department (HOSPITAL_COMMUNITY)
Admission: EM | Admit: 2013-10-05 | Discharge: 2013-10-06 | Disposition: A | Discharge status: Home / Self Care | Payer: BC Managed Care – PPO | Attending: Emergency Medicine | Admitting: Emergency Medicine

## 2013-10-05 DIAGNOSIS — N730 Acute parametritis and pelvic cellulitis: Secondary | ICD-10-CM

## 2013-10-05 DIAGNOSIS — Z3202 Encounter for pregnancy test, result negative: Secondary | ICD-10-CM | POA: Insufficient documentation

## 2013-10-05 DIAGNOSIS — R110 Nausea: Secondary | ICD-10-CM | POA: Insufficient documentation

## 2013-10-05 DIAGNOSIS — N739 Female pelvic inflammatory disease, unspecified: Secondary | ICD-10-CM | POA: Insufficient documentation

## 2013-10-05 DIAGNOSIS — Z79899 Other long term (current) drug therapy: Secondary | ICD-10-CM | POA: Insufficient documentation

## 2013-10-05 LAB — POC URINE PREG, ED: PREG TEST UR: NEGATIVE

## 2013-10-05 LAB — URINALYSIS, ROUTINE W REFLEX MICROSCOPIC
Bilirubin Urine: NEGATIVE
GLUCOSE, UA: NEGATIVE mg/dL
HGB URINE DIPSTICK: NEGATIVE
KETONES UR: NEGATIVE mg/dL
LEUKOCYTES UA: NEGATIVE
Nitrite: NEGATIVE
PH: 5.5 (ref 5.0–8.0)
PROTEIN: NEGATIVE mg/dL
Specific Gravity, Urine: 1.015 (ref 1.005–1.030)
Urobilinogen, UA: 0.2 mg/dL (ref 0.0–1.0)

## 2013-10-05 LAB — CBC WITH DIFFERENTIAL/PLATELET
BASOS PCT: 1 % (ref 0–1)
Basophils Absolute: 0 10*3/uL (ref 0.0–0.1)
EOS PCT: 2 % (ref 0–5)
Eosinophils Absolute: 0.1 10*3/uL (ref 0.0–0.7)
HCT: 36.5 % (ref 36.0–46.0)
HEMOGLOBIN: 12 g/dL (ref 12.0–15.0)
LYMPHS PCT: 43 % (ref 12–46)
Lymphs Abs: 1.8 10*3/uL (ref 0.7–4.0)
MCH: 23.1 pg — AB (ref 26.0–34.0)
MCHC: 32.9 g/dL (ref 30.0–36.0)
MCV: 70.3 fL — ABNORMAL LOW (ref 78.0–100.0)
MONOS PCT: 7 % (ref 3–12)
Monocytes Absolute: 0.3 10*3/uL (ref 0.1–1.0)
NEUTROS PCT: 47 % (ref 43–77)
Neutro Abs: 1.9 10*3/uL (ref 1.7–7.7)
PLATELETS: ADEQUATE 10*3/uL (ref 150–400)
RBC: 5.19 MIL/uL — AB (ref 3.87–5.11)
RDW: 15.1 % (ref 11.5–15.5)
SMEAR REVIEW: ADEQUATE
WBC: 4.1 10*3/uL (ref 4.0–10.5)

## 2013-10-05 LAB — COMPREHENSIVE METABOLIC PANEL
ALBUMIN: 4.2 g/dL (ref 3.5–5.2)
ALT: 15 U/L (ref 0–35)
AST: 18 U/L (ref 0–37)
Alkaline Phosphatase: 78 U/L (ref 39–117)
Anion gap: 13 (ref 5–15)
BILIRUBIN TOTAL: 0.7 mg/dL (ref 0.3–1.2)
BUN: 15 mg/dL (ref 6–23)
CHLORIDE: 105 meq/L (ref 96–112)
CO2: 23 meq/L (ref 19–32)
CREATININE: 1.02 mg/dL (ref 0.50–1.10)
Calcium: 9.3 mg/dL (ref 8.4–10.5)
GFR calc Af Amer: 88 mL/min — ABNORMAL LOW (ref >90)
GFR, EST NON AFRICAN AMERICAN: 76 mL/min — AB (ref >90)
Glucose, Bld: 86 mg/dL (ref 70–99)
POTASSIUM: 4 meq/L (ref 3.7–5.3)
SODIUM: 141 meq/L (ref 137–147)
Total Protein: 7.5 g/dL (ref 6.0–8.3)

## 2013-10-05 LAB — POC OCCULT BLOOD, ED: Fecal Occult Bld: NEGATIVE

## 2013-10-05 LAB — WET PREP, GENITAL
CLUE CELLS WET PREP: NONE SEEN
TRICH WET PREP: NONE SEEN
WBC, Wet Prep HPF POC: NONE SEEN
YEAST WET PREP: NONE SEEN

## 2013-10-05 MED ORDER — ONDANSETRON HCL 4 MG/2ML IJ SOLN
4.0000 mg | Freq: Once | INTRAMUSCULAR | Status: AC
  Administered 2013-10-05: 4 mg via INTRAVENOUS
  Filled 2013-10-05: qty 2

## 2013-10-05 MED ORDER — KETOROLAC TROMETHAMINE 30 MG/ML IJ SOLN
30.0000 mg | Freq: Once | INTRAMUSCULAR | Status: AC
  Administered 2013-10-05: 30 mg via INTRAVENOUS
  Filled 2013-10-05: qty 1

## 2013-10-05 MED ORDER — SODIUM CHLORIDE 0.9 % IV BOLUS (SEPSIS)
1000.0000 mL | Freq: Once | INTRAVENOUS | Status: AC
  Administered 2013-10-05: 1000 mL via INTRAVENOUS

## 2013-10-05 NOTE — ED Provider Notes (Signed)
This chart was scribed for Layla MawKristen N Ward, DO by Milly JakobJohn Lee Graves, ED Scribe. The patient was seen in room APA08/APA08. Patient's care was started at 10:36 PM.   CHIEF COMPLAINT: abdominal pain  HPI: HPI Comments: Tammy Oliver is a 23 y.o. female no significant past medical history who presents to the Emergency Department complaining of constant moderate abdominal pain that radiates to her back 2-3 weeks ago. She reports associated nausea. She reports an episode of hematochezia. No melena. No vomiting. No fevers or chills. She reports that she recently had a kidney infection. She reports that she is sexually active with 1 partner. She denies vaginal bleeding or discharge, dysuria, and hematuria. She denies any sick relatives or international travel. She states that she has had one sexual transmitted disease before. Denies ever being pregnant. No history of abdominal surgery. Last menstrual period was "last month".  ROS: See HPI Constitutional: no fever  Eyes: no drainage  ENT: no runny nose   Cardiovascular:  no chest pain  Resp: no SOB  GI: nausea, no vomiting GU: no dysuria, no hematuria, no vaginal bleeding Integumentary: no rash  Allergy: no hives  Musculoskeletal: no leg swelling  Neurological: no slurred speech ROS otherwise negative  PAST MEDICAL HISTORY/PAST SURGICAL HISTORY:  History reviewed. No pertinent past medical history.  MEDICATIONS:  Prior to Admission medications   Medication Sig Start Date End Date Taking? Authorizing Provider  medroxyPROGESTERone (DEPO-PROVERA) 150 MG/ML injection Inject 150 mg into the muscle every 3 (three) months.    Historical Provider, MD    ALLERGIES:  Allergies  Allergen Reactions  . Bee Venom Swelling    SOCIAL HISTORY:  History  Substance Use Topics  . Smoking status: Never Smoker   . Smokeless tobacco: Never Used  . Alcohol Use: No    FAMILY HISTORY: No family history on file.  EXAM: BP 111/67  Pulse 94  Temp(Src)  97.8 F (36.6 C) (Oral)  Resp 24  Ht 5\' 1"  (1.549 m)  Wt 133 lb (60.328 kg)  BMI 25.14 kg/m2  SpO2 100% CONSTITUTIONAL: Alert and oriented and responds appropriately to questions. Appears uncomfortable but is nontoxic, well-hydrated, in no distress HEAD: Normocephalic EYES: Conjunctivae clear, PERRL ENT: normal nose; no rhinorrhea; moist mucous membranes; pharynx without lesions noted NECK: Supple, no meningismus, no LAD  CARD: RRR; S1 and S2 appreciated; no murmurs, no clicks, no rubs, no gallops RESP: Normal chest excursion without splinting or tachypnea; breath sounds clear and equal bilaterally; no wheezes, no rhonchi, no rales,  ABD/GI: Normal bowel sounds; non-distended; soft, diffusely mildly tender to palpation without guarding or rebound, no peritoneal signs GU:  Normal external genitalia, patient has mild cervical motion tenderness with mucoid discharge from the cervical os, no adnexal tenderness or fullness, no vaginal bleeding RECTAL:  Normal rectal tone, patient has no gross blood or melena, stool is tan colored and soft BACK:  The back appears normal and is non-tender to palpation, there is no CVA tenderness EXT: Normal ROM in all joints; non-tender to palpation; no edema; normal capillary refill; no cyanosis    SKIN: Normal color for age and race; warm NEURO: Moves all extremities equally PSYCH: The patient's mood and manner are appropriate. Grooming and personal hygiene are appropriate.  MEDICAL DECISION MAKING: Patient here with several weeks of abdominal pain and back pain. She has cervical motion tenderness on her pelvic exam, concern for possible mild PID. She is sexually active with one partner and has had a prior STD.  Otherwise her abdominal exam is very benign. We'll obtain labs, urine. Will give IV fluids, pain and nausea medicine and reassess.  ED PROGRESS: Patient's labs are completely unremarkable. There is no leukocytosis. Her wet prep is negative. Hemoccult is  negative. Urine is pending.  12:16 AM  Pt appears much more comfortable on exam. Her urine shows no sign of infection. Urine pregnancy negative. Will treat empirically for STDs given her cervical motion tenderness and discharge with prescription for mild PID even though her wet prep is negative for WBCs. Discussed supportive care instructions and return precautions. She verbalized understanding and is comfortable with this plan. We'll give work note per patient request as she states she works third shift tonight.   I personally performed the services described in this documentation, which was scribed in my presence. The recorded information has been reviewed and is accurate.      Layla MawKristen N Ward, DO 10/06/13 [phone]

## 2013-10-05 NOTE — ED Notes (Signed)
Abdominal pain and back pain, have been vomiting on and off per pt

## 2013-10-06 MED ORDER — DOXYCYCLINE HYCLATE 100 MG PO CAPS: 100.0000 mg | ORAL_CAPSULE | Freq: Two times a day (BID) | ORAL | Status: DC

## 2013-10-06 MED ORDER — AZITHROMYCIN 250 MG PO TABS
1000.0000 mg | ORAL_TABLET | Freq: Once | ORAL | Status: AC
  Administered 2013-10-06: 1000 mg via ORAL
  Filled 2013-10-06: qty 4

## 2013-10-06 MED ORDER — CEFTRIAXONE SODIUM 250 MG IJ SOLR
250.0000 mg | Freq: Once | INTRAMUSCULAR | Status: AC
  Administered 2013-10-06: 250 mg via INTRAMUSCULAR
  Filled 2013-10-06: qty 250

## 2013-10-06 MED ORDER — STERILE WATER FOR INJECTION IJ SOLN
INTRAMUSCULAR | Status: AC
  Administered 2013-10-06
  Filled 2013-10-06: qty 10

## 2013-10-06 MED ORDER — IBUPROFEN 800 MG PO TABS: 800.0000 mg | ORAL_TABLET | Freq: Three times a day (TID) | ORAL | Status: DC | PRN

## 2013-10-06 MED ORDER — ONDANSETRON HCL 4 MG PO TABS: 4.0000 mg | ORAL_TABLET | Freq: Four times a day (QID) | ORAL | Status: DC

## 2013-10-06 NOTE — Discharge Instructions (Signed)
Pelvic Inflammatory Disease °Pelvic inflammatory disease (PID) refers to an infection in some or all of the female organs. The infection can be in the uterus, ovaries, fallopian tubes, or the surrounding tissues in the pelvis. PID can cause abdominal or pelvic pain that comes on suddenly (acute pelvic pain). PID is a serious infection because it can lead to lasting (chronic) pelvic pain or the inability to have children (infertile).  °CAUSES  °The infection is often caused by the normal bacteria found in the vaginal tissues. PID may also be caused by an infection that is spread during sexual contact. PID can also occur following:  °· The birth of a baby.   °· A miscarriage.   °· An abortion.   °· Major pelvic surgery.   °· The use of an intrauterine device (IUD).   °· A sexual assault.   °RISK FACTORS °Certain factors can put a person at higher risk for PID, such as: °· Being younger than 25 years. °· Being sexually active at a young age. °· Using nonbarrier contraception. °· Having multiple sexual partners. °· Having sex with someone who has symptoms of a genital infection. °· Using oral contraception. °Other times, certain behaviors can increase the possibility of getting PID, such as: °· Having sex during your period. °· Using a vaginal douche. °· Having an intrauterine device (IUD) in place. °SYMPTOMS  °· Abdominal or pelvic pain.   °· Fever.   °· Chills.   °· Abnormal vaginal discharge. °· Abnormal uterine bleeding.   °· Unusual pain shortly after finishing your period. °DIAGNOSIS  °Your caregiver will choose some of the following methods to make a diagnosis, such as:  °· Performing a physical exam and history. A pelvic exam typically reveals a very tender uterus and surrounding pelvis.   °· Ordering laboratory tests including a pregnancy test, blood tests, and urine test.  °· Ordering cultures of the vagina and cervix to check for a sexually transmitted infection (STI). °· Performing an ultrasound.    °· Performing a laparoscopic procedure to look inside the pelvis.   °TREATMENT  °· Antibiotic medicines may be prescribed and taken by mouth.   °· Sexual partners may be treated when the infection is caused by a sexually transmitted disease (STD).   °· Hospitalization may be needed to give antibiotics intravenously. °· Surgery may be needed, but this is rare. °It may take weeks until you are completely well. If you are diagnosed with PID, you should also be checked for human immunodeficiency virus (HIV).   °HOME CARE INSTRUCTIONS  °· If given, take your antibiotics as directed. Finish the medicine even if you start to feel better.   °· Only take over-the-counter or prescription medicines for pain, discomfort, or fever as directed by your caregiver.   °· Do not have sexual intercourse until treatment is completed or as directed by your caregiver. If PID is confirmed, your recent sexual partner(s) will need treatment.   °· Keep your follow-up appointments. °SEEK MEDICAL CARE IF:  °· You have increased or abnormal vaginal discharge.   °· You need prescription medicine for your pain.   °· You vomit.   °· You cannot take your medicines.   °· Your partner has an STD.   °SEEK IMMEDIATE MEDICAL CARE IF:  °· You have a fever.   °· You have increased abdominal or pelvic pain.   °· You have chills.   °· You have pain when you urinate.   °· You are not better after 72 hours following treatment.   °MAKE SURE YOU:  °· Understand these instructions. °· Will watch your condition. °· Will get help right away if you are not doing well or get worse. °  Document Released: 03/24/2005 Document Revised: 07/19/2012 Document Reviewed: 03/20/2011 °[employer]® Patient Information ©2015 [employer]. This information is not intended to replace advice given to you by your health care provider. Make sure you discuss any questions you have with your health care provider. ° °

## 2013-10-07 LAB — GC/CHLAMYDIA PROBE AMP
CT Probe RNA: NEGATIVE
GC Probe RNA: NEGATIVE

## 2014-01-18 ENCOUNTER — Emergency Department (HOSPITAL_COMMUNITY)
Admission: EM | Admit: 2014-01-18 | Discharge: 2014-01-18 | Disposition: A | Discharge status: Home / Self Care | Payer: BC Managed Care – PPO | Attending: Emergency Medicine | Admitting: Emergency Medicine

## 2014-01-18 ENCOUNTER — Encounter (HOSPITAL_COMMUNITY): Payer: Self-pay | Admitting: Emergency Medicine

## 2014-01-18 DIAGNOSIS — F419 Anxiety disorder, unspecified: Secondary | ICD-10-CM | POA: Diagnosis not present

## 2014-01-18 DIAGNOSIS — R251 Tremor, unspecified: Secondary | ICD-10-CM | POA: Diagnosis not present

## 2014-01-18 DIAGNOSIS — Z79899 Other long term (current) drug therapy: Secondary | ICD-10-CM | POA: Insufficient documentation

## 2014-01-18 DIAGNOSIS — R51 Headache: Secondary | ICD-10-CM | POA: Insufficient documentation

## 2014-01-18 MED ORDER — LORAZEPAM 1 MG PO TABS
1.0000 mg | ORAL_TABLET | Freq: Once | ORAL | Status: AC
  Administered 2014-01-18: 1 mg via ORAL
  Filled 2014-01-18: qty 1

## 2014-01-18 MED ORDER — ACETAMINOPHEN 325 MG PO TABS
650.0000 mg | ORAL_TABLET | Freq: Once | ORAL | Status: AC
  Administered 2014-01-18: 650 mg via ORAL
  Filled 2014-01-18: qty 2

## 2014-01-18 MED ORDER — LORAZEPAM 1 MG PO TABS: ORAL_TABLET | ORAL | Status: AC

## 2014-01-18 MED ORDER — LORAZEPAM 2 MG/ML IJ SOLN
1.0000 mg | Freq: Once | INTRAMUSCULAR | Status: AC
  Administered 2014-01-18: 1 mg via INTRAMUSCULAR
  Filled 2014-01-18: qty 1
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
## 2014-01-18 NOTE — Discharge Instructions (Signed)
Follow up with your md next week. °

## 2014-01-18 NOTE — ED Notes (Signed)
Pt requesting tylenol for her headache.
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
## 2014-02-06 ENCOUNTER — Encounter (HOSPITAL_COMMUNITY): Payer: Self-pay | Admitting: Emergency Medicine

## 2014-06-11 ENCOUNTER — Emergency Department (HOSPITAL_COMMUNITY)
Admission: EM | Admit: 2014-06-11 | Discharge: 2014-06-12 | Disposition: A | Discharge status: Home / Self Care | Payer: Medicaid Other | Attending: Emergency Medicine | Admitting: Emergency Medicine

## 2014-06-11 ENCOUNTER — Encounter (HOSPITAL_COMMUNITY): Payer: Self-pay

## 2014-06-11 DIAGNOSIS — Z3202 Encounter for pregnancy test, result negative: Secondary | ICD-10-CM | POA: Insufficient documentation

## 2014-06-11 DIAGNOSIS — Z79899 Other long term (current) drug therapy: Secondary | ICD-10-CM | POA: Insufficient documentation

## 2014-06-11 DIAGNOSIS — E860 Dehydration: Secondary | ICD-10-CM

## 2014-06-11 DIAGNOSIS — K219 Gastro-esophageal reflux disease without esophagitis: Secondary | ICD-10-CM | POA: Insufficient documentation

## 2014-06-11 DIAGNOSIS — R112 Nausea with vomiting, unspecified: Secondary | ICD-10-CM | POA: Insufficient documentation

## 2014-06-11 DIAGNOSIS — F329 Major depressive disorder, single episode, unspecified: Secondary | ICD-10-CM | POA: Insufficient documentation

## 2014-06-11 HISTORY — DX: Major depressive disorder, single episode, unspecified: F32.9

## 2014-06-11 HISTORY — DX: Gastro-esophageal reflux disease without esophagitis: K21.9

## 2014-06-11 HISTORY — DX: Depression, unspecified: F32.A

## 2014-06-11 LAB — BASIC METABOLIC PANEL
Anion gap: 6 (ref 5–15)
BUN: 9 mg/dL (ref 6–23)
CALCIUM: 8.6 mg/dL (ref 8.4–10.5)
CO2: 23 mmol/L (ref 19–32)
Chloride: 106 mmol/L (ref 96–112)
Creatinine, Ser: 0.94 mg/dL (ref 0.50–1.10)
GFR calc Af Amer: >90 mL/min (ref >90)
GFR calc non Af Amer: 84 mL/min — ABNORMAL LOW (ref >90)
GLUCOSE: 101 mg/dL — AB (ref 70–99)
Potassium: 3.4 mmol/L — ABNORMAL LOW (ref 3.5–5.1)
Sodium: 135 mmol/L (ref 135–145)

## 2014-06-11 LAB — CBC WITH DIFFERENTIAL/PLATELET
BASOS ABS: 0 10*3/uL (ref 0.0–0.1)
BASOS PCT: 0 % (ref 0–1)
EOS ABS: 0 10*3/uL (ref 0.0–0.7)
EOS PCT: 1 % (ref 0–5)
HCT: 36.1 % (ref 36.0–46.0)
HEMOGLOBIN: 11.8 g/dL — AB (ref 12.0–15.0)
LYMPHS ABS: 0.7 10*3/uL (ref 0.7–4.0)
Lymphocytes Relative: 11 % — ABNORMAL LOW (ref 12–46)
MCH: 23.2 pg — ABNORMAL LOW (ref 26.0–34.0)
MCHC: 32.7 g/dL (ref 30.0–36.0)
MCV: 70.9 fL — AB (ref 78.0–100.0)
MONOS PCT: 4 % (ref 3–12)
Monocytes Absolute: 0.2 10*3/uL (ref 0.1–1.0)
NEUTROS ABS: 5.9 10*3/uL (ref 1.7–7.7)
NEUTROS PCT: 85 % — AB (ref 43–77)
PLATELETS: 122 10*3/uL — AB (ref 150–400)
RBC: 5.09 MIL/uL (ref 3.87–5.11)
RDW: 14.7 % (ref 11.5–15.5)
WBC: 6.9 10*3/uL (ref 4.0–10.5)

## 2014-06-11 MED ORDER — MORPHINE SULFATE 4 MG/ML IJ SOLN
4.0000 mg | Freq: Once | INTRAMUSCULAR | Status: AC
  Administered 2014-06-11: 4 mg via INTRAVENOUS
  Filled 2014-06-11: qty 1

## 2014-06-11 MED ORDER — SODIUM CHLORIDE 0.9 % IV BOLUS (SEPSIS)
2000.0000 mL | Freq: Once | INTRAVENOUS | Status: AC
Start: 2014-06-11 — End: 2014-06-12
  Administered 2014-06-11: 2000 mL via INTRAVENOUS

## 2014-06-11 MED ORDER — ONDANSETRON HCL 4 MG/2ML IJ SOLN
INTRAMUSCULAR | Status: AC
  Administered 2014-06-11: 4 mg
  Filled 2014-06-11: qty 2

## 2014-06-11 NOTE — ED Provider Notes (Signed)
CSN: [id number]     Arrival date & time 06/11/14  2208 History  This chart was scribed for Lyanne CoKevin M Britney Captain, MD by Tonye RoyaltyJoshua Chen, ED Scribe. This patient was seen in room APA05/APA05 and the patient's care was started at 11:41 PM.    Chief Complaint  Patient presents with  . Abdominal Pain   The history is provided by the patient. No language interpreter was used.    HPI Comments: Tammy Oliver is a 24 y.o. female who presents to the Emergency Department complaining of chest pain with onset today. She states she does not have pain now. She notes she had similar pain 1 week ago and [facility]. She reports associated vomiting starting at 1700 today. She reports associated abdominal pain, generalized weakness, fever, and chills. She states she has coughing when she vomits. She denies diarrhea, SOB,difficulty urinating, dysuria, or frequency.  Past Medical History  Diagnosis Date  . Acid reflux   . Depression    Past Surgical History  Procedure Laterality Date  . Tonsillectomy     No family history on file. History  Substance Use Topics  . Smoking status: Never Smoker   . Smokeless tobacco: Never Used  . Alcohol Use: No   OB History    Gravida Para Term Preterm AB TAB SAB Ectopic Multiple Living   1 1 1       1      Review of Systems A complete 10 system review of systems was obtained and all systems are negative except as noted in the HPI and PMH.    Allergies  Bee venom  Home Medications   Prior to Admission medications   Medication Sig Start Date End Date Taking? Authorizing Provider  LORazepam (ATIVAN) 1 MG tablet Take one every 12 hours as needed for severe anxiety 01/18/14  Yes Benny LennertJoseph L Zammit, MD  omeprazole (PRILOSEC) 20 MG capsule Take 20 mg by mouth daily.   Yes Historical Provider, MD  sertraline (ZOLOFT) 50 MG tablet Take 50 mg by mouth daily as needed.   Yes Historical Provider, MD  traZODone (DESYREL) 50 MG tablet Take 50 mg by  mouth at bedtime as needed for sleep.   Yes Historical Provider, MD   BP 118/78 mmHg  Pulse 106  Temp(Src) 98.7 F (37.1 C) (Oral)  Resp 20  Ht 5\' 2"  (1.575 m)  Wt 150 lb (68.04 kg)  BMI 27.43 kg/m2  SpO2 100%  LMP 05/08/2014 Physical Exam  Constitutional: She is oriented to person, place, and time. She appears well-developed and well-nourished. No distress.  HENT:  Head: Normocephalic and atraumatic.  Eyes: EOM are normal.  Neck: Normal range of motion.  Cardiovascular: Normal rate, regular rhythm and normal heart sounds.   Pulmonary/Chest: Effort normal and breath sounds normal.  Abdominal: Soft. She exhibits no distension. There is tenderness (mild upper abdominal tenderness).  Musculoskeletal: Normal range of motion.  Neurological: She is alert and oriented to person, place, and time.  Skin: Skin is warm and dry.  Psychiatric: She has a normal mood and affect. Judgment normal.  Nursing note and vitals reviewed.   ED Course  Procedures (including critical care time)  DIAGNOSTIC STUDIES: Oxygen Saturation is 100% on room air, normal by my interpretation.    COORDINATION OF CARE: 11:45 PM She states nausea is improved after medication. Discussed treatment plan with patient at beside, including lab tests. The patient agrees with the plan and has no further questions at  this time.   Labs Review Labs Reviewed  BASIC METABOLIC PANEL - Abnormal; Notable for the following:    Potassium 3.4 (*)    Glucose, Bld 101 (*)    GFR calc non Af Amer 84 (*)    All other components within normal limits  CBC WITH DIFFERENTIAL/PLATELET - Abnormal; Notable for the following:    Hemoglobin 11.8 (*)    MCV 70.9 (*)    MCH 23.2 (*)    Platelets 122 (*)    Neutrophils Relative % 85 (*)    Lymphocytes Relative 11 (*)    All other components within normal limits  HEPATIC FUNCTION PANEL  LIPASE, BLOOD  PREGNANCY, URINE  URINALYSIS, ROUTINE W REFLEX MICROSCOPIC    Imaging Review No  results found.   EKG Interpretation None      MDM   Final diagnoses:  Nausea and vomiting, vomiting of unspecified type  Dehydration    12:50 AM Patient is feeling much better this time.  She's keeping fluids down this time.  Discharge home in good condition.  Home with nausea medicine.  Patient understands return to the ER for new or worsening symptoms.  Primary care follow-up.  Repeat abdominal exam benign.  No tenderness.  Heart rate improved  I personally performed the services described in this documentation, which was scribed in my presence. The recorded information has been reviewed and is accurate.      Lyanne Co, MD 06/12/14 [phone]

## 2014-06-11 NOTE — ED Notes (Signed)
Last week I was diagnosed with acid reflux and today I started having epigastric pain and vomiting per pt.

## 2014-06-12 LAB — HEPATIC FUNCTION PANEL
ALT: 16 U/L (ref 0–35)
AST: 21 U/L (ref 0–37)
Albumin: 4.4 g/dL (ref 3.5–5.2)
Alkaline Phosphatase: 88 U/L (ref 39–117)
BILIRUBIN TOTAL: 0.9 mg/dL (ref 0.3–1.2)
Bilirubin, Direct: 0.2 mg/dL (ref 0.0–0.5)
Indirect Bilirubin: 0.7 mg/dL (ref 0.3–0.9)
Total Protein: 7.6 g/dL (ref 6.0–8.3)

## 2014-06-12 LAB — LIPASE, BLOOD: LIPASE: 16 U/L (ref 11–59)

## 2014-06-12 LAB — URINALYSIS, ROUTINE W REFLEX MICROSCOPIC
Bilirubin Urine: NEGATIVE
GLUCOSE, UA: NEGATIVE mg/dL
Hgb urine dipstick: NEGATIVE
Ketones, ur: NEGATIVE mg/dL
Leukocytes, UA: NEGATIVE
Nitrite: NEGATIVE
PROTEIN: NEGATIVE mg/dL
Specific Gravity, Urine: 1.015 (ref 1.005–1.030)
Urobilinogen, UA: 0.2 mg/dL (ref 0.0–1.0)
pH: 7 (ref 5.0–8.0)

## 2014-06-12 LAB — PREGNANCY, URINE: PREG TEST UR: NEGATIVE

## 2014-06-12 MED ORDER — ONDANSETRON 8 MG PO TBDP: 8.0000 mg | ORAL_TABLET | Freq: Three times a day (TID) | ORAL | Status: AC | PRN

## 2015-03-25 IMAGING — CR DG TIBIA/FIBULA 2V*R*
2 series · 2 of 2 positions shown · non-contrast
Comparison: None.

CLINICAL DATA: Leg pain and ankle pain post injury

RIGHT TIBIA AND FIBULA - 2 VIEW

[view not recorded (1 of 2)]
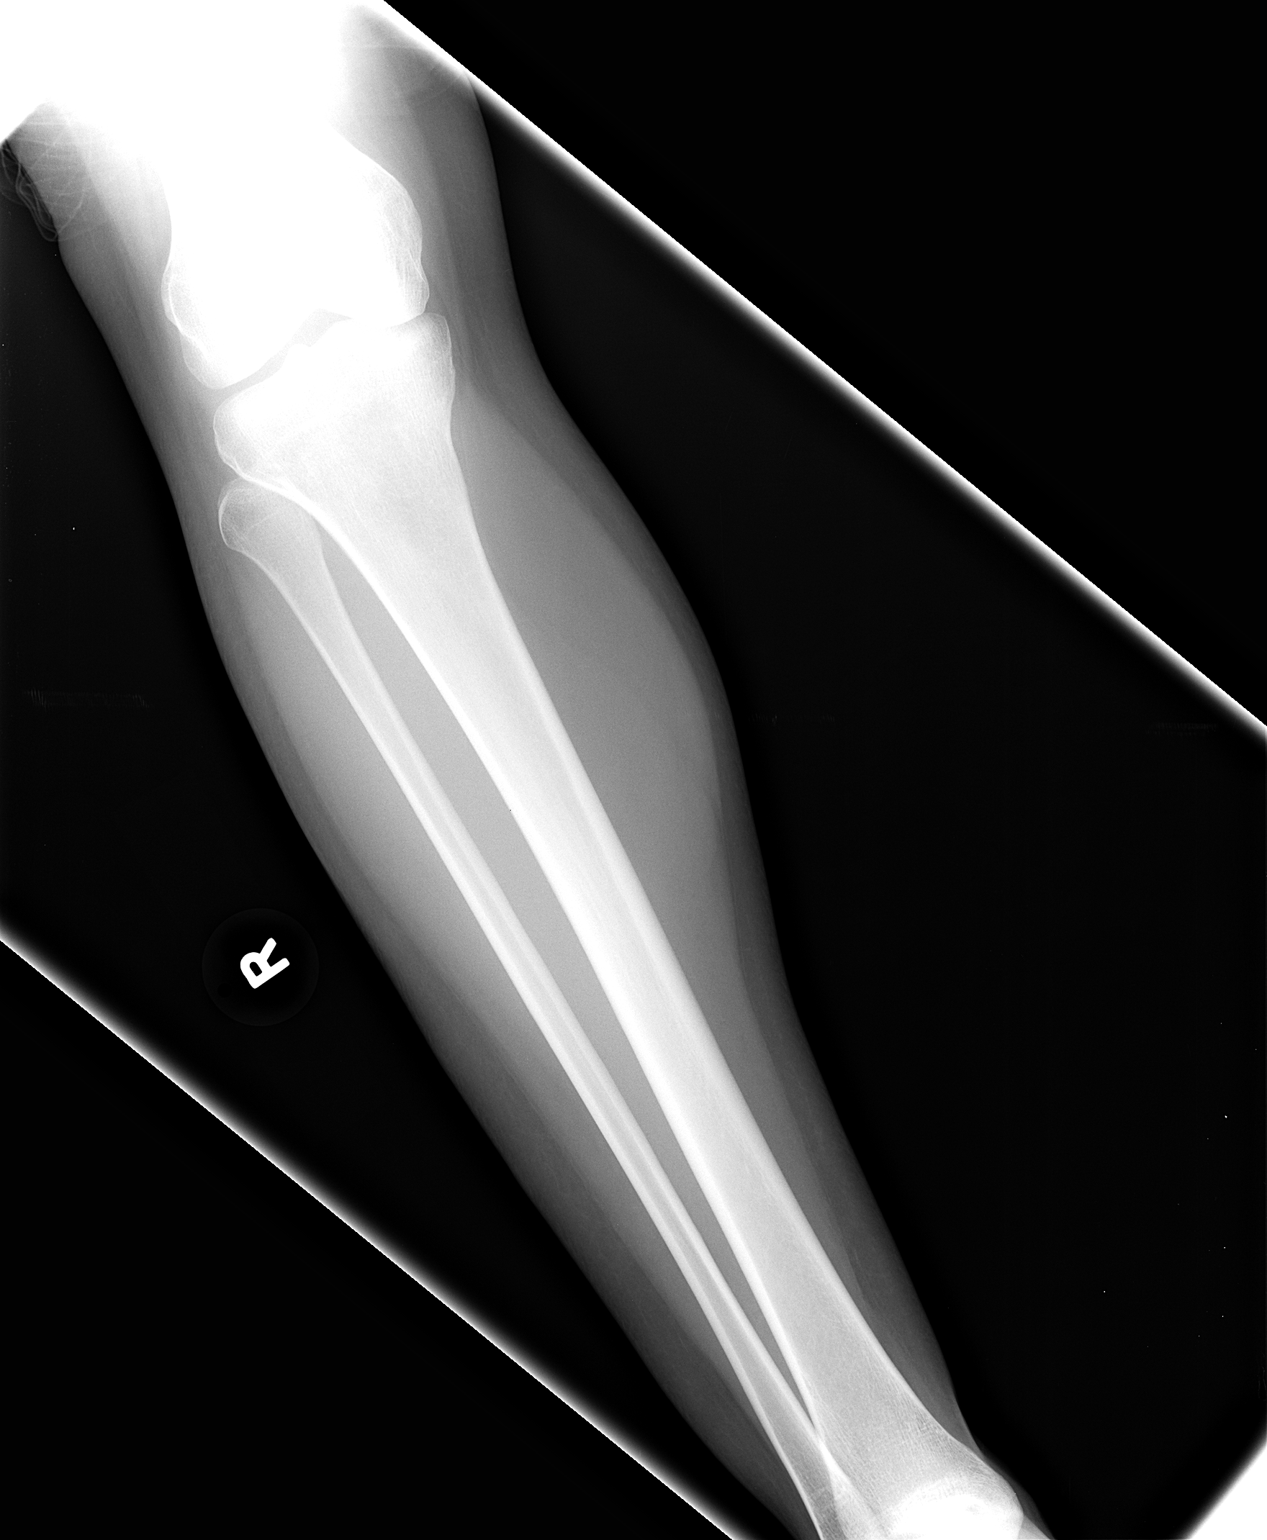

[view not recorded (2 of 2)]
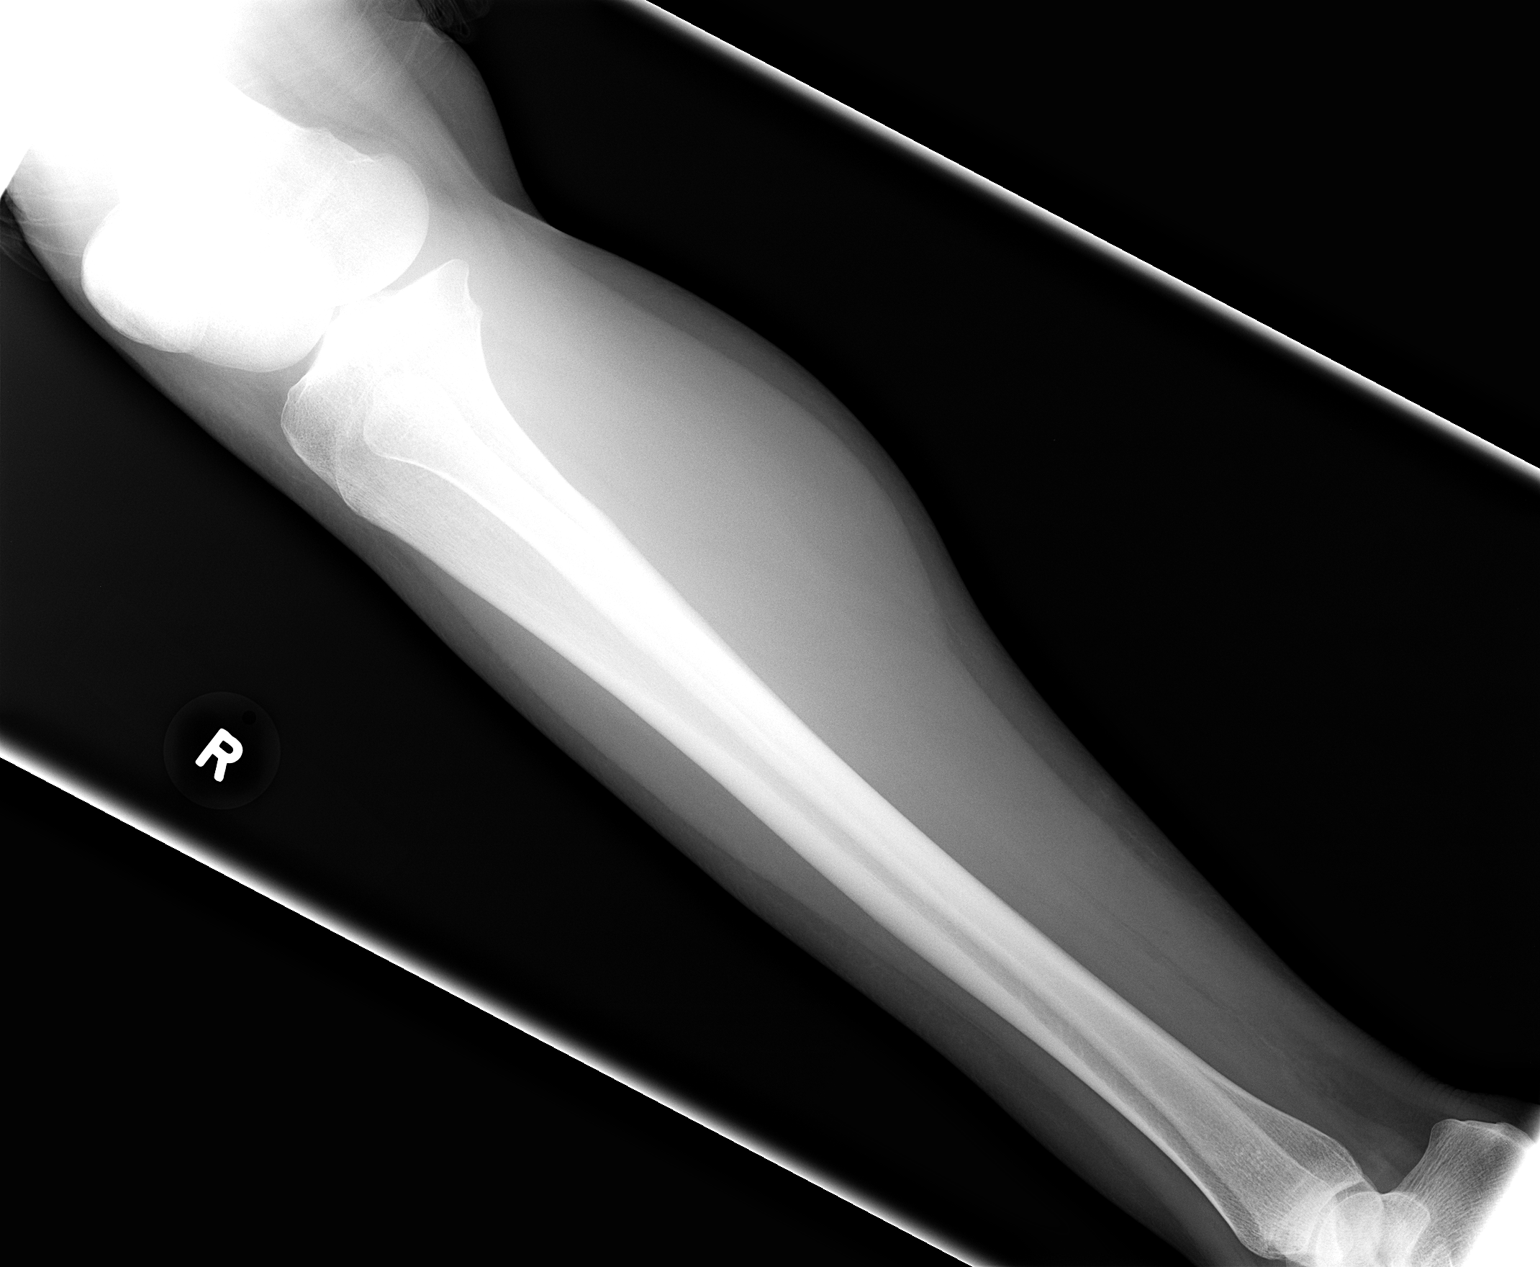

[2 of 2 positions shown; findings below may reference images not displayed]

FINDINGS: Two views of the right tibia-fibula submitted.  No acute
fracture or subluxation.  No radiopaque foreign body.
IMPRESSION: No acute fracture or subluxation.

## 2015-03-25 IMAGING — CR DG ANKLE COMPLETE 3+V*R*
3 series · 3 of 3 positions shown · non-contrast
Comparison: None.

CLINICAL DATA: Ankle pain post injury

RIGHT ANKLE - COMPLETE 3+ VIEW

[view not recorded (1 of 3)]
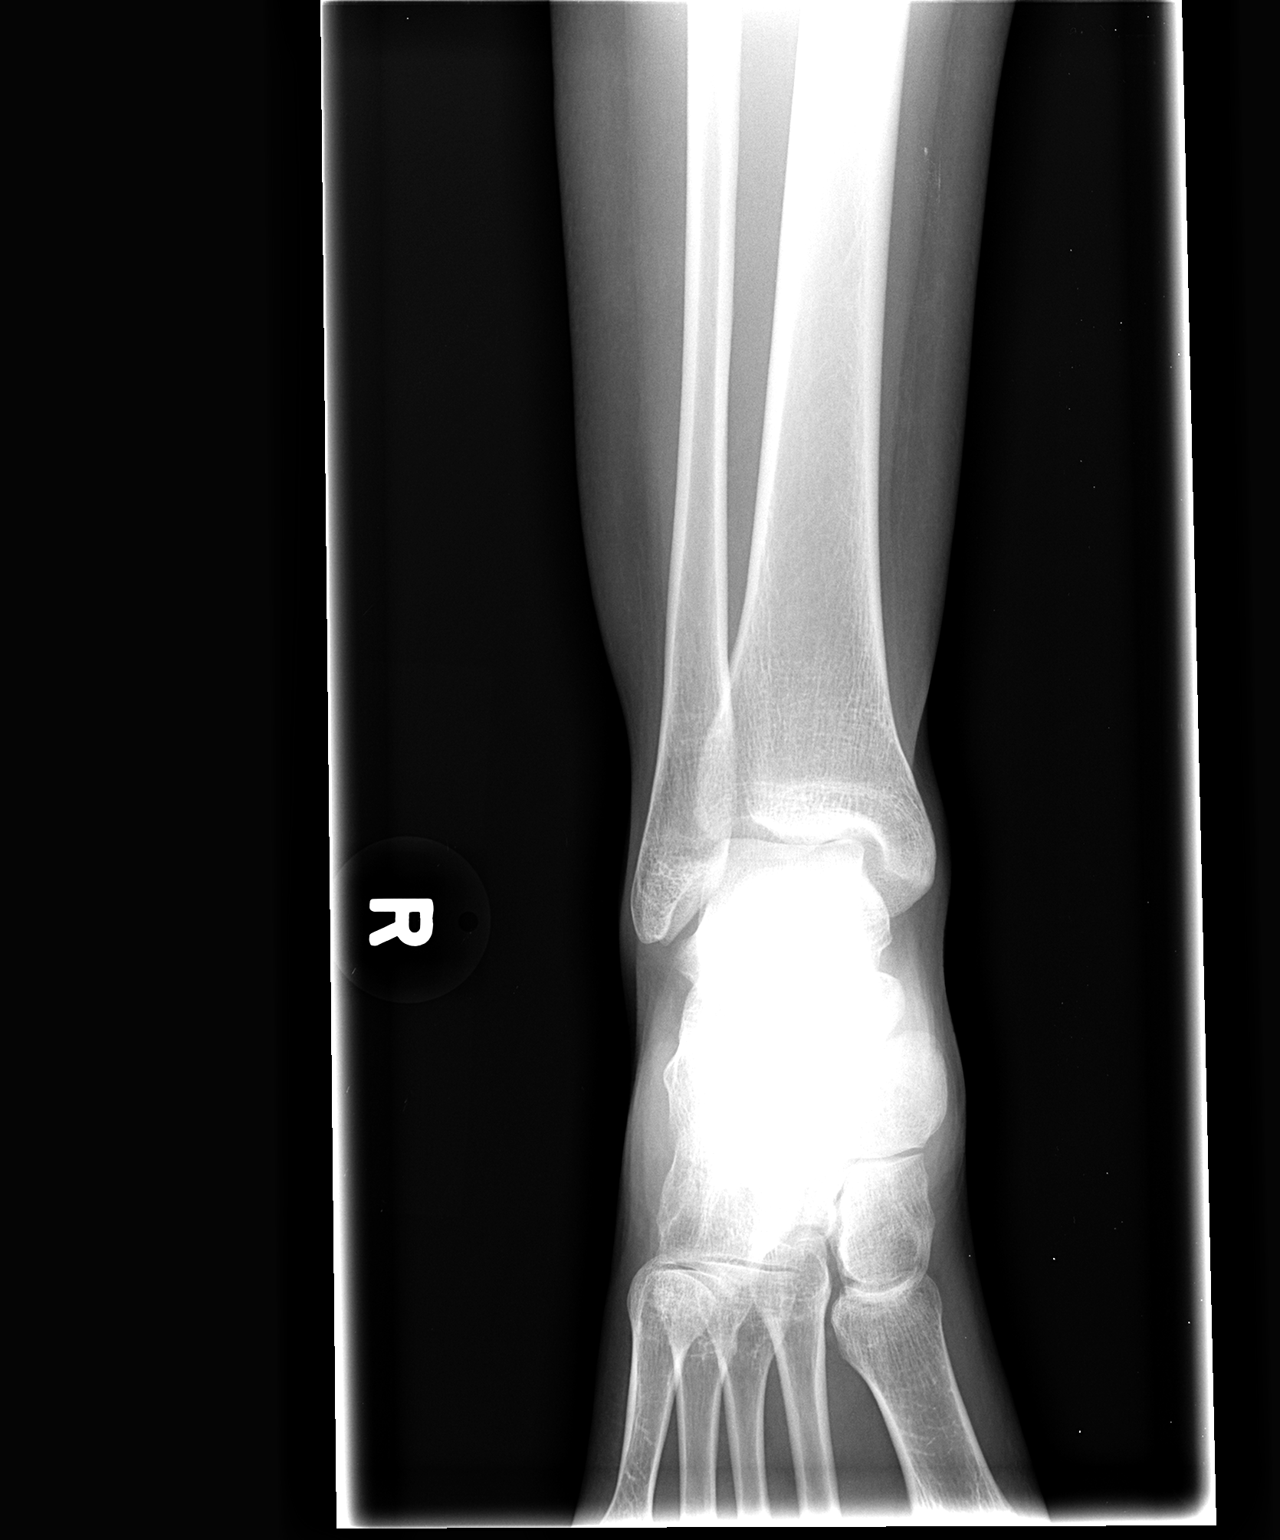

[view not recorded (2 of 3)]
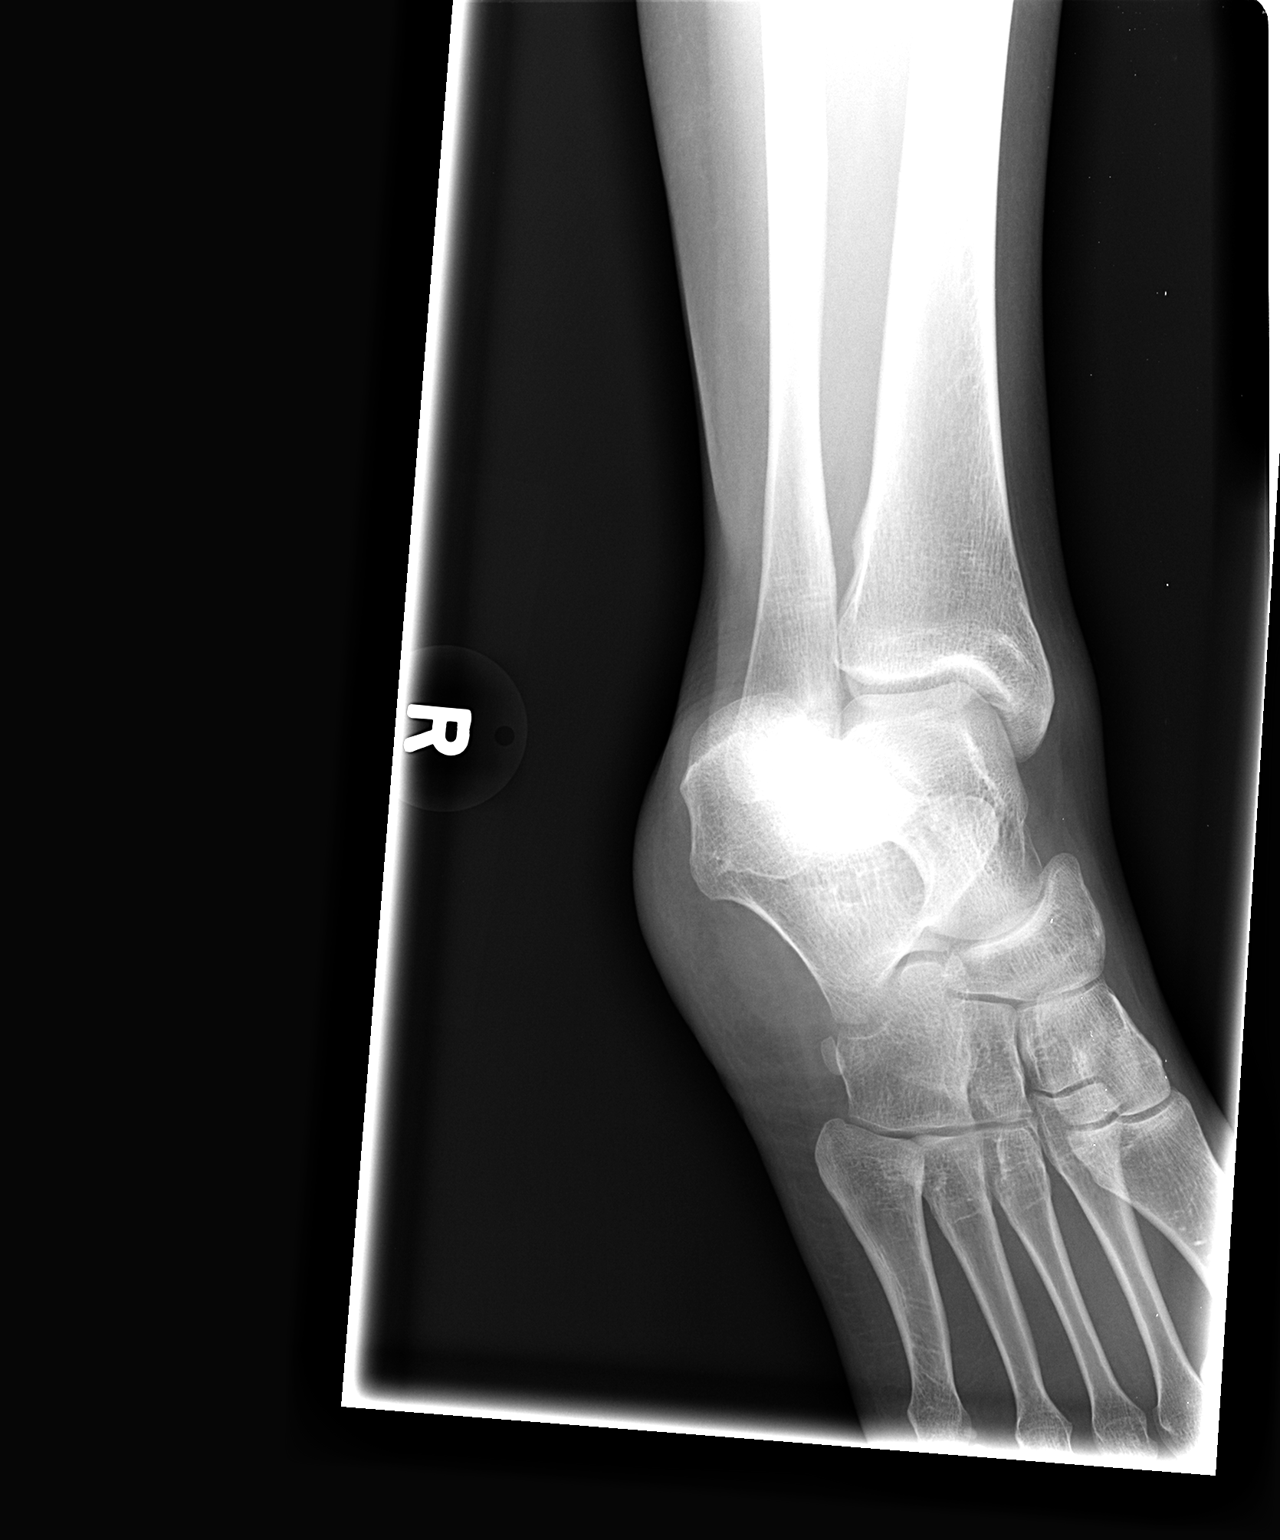

[view not recorded (3 of 3)]
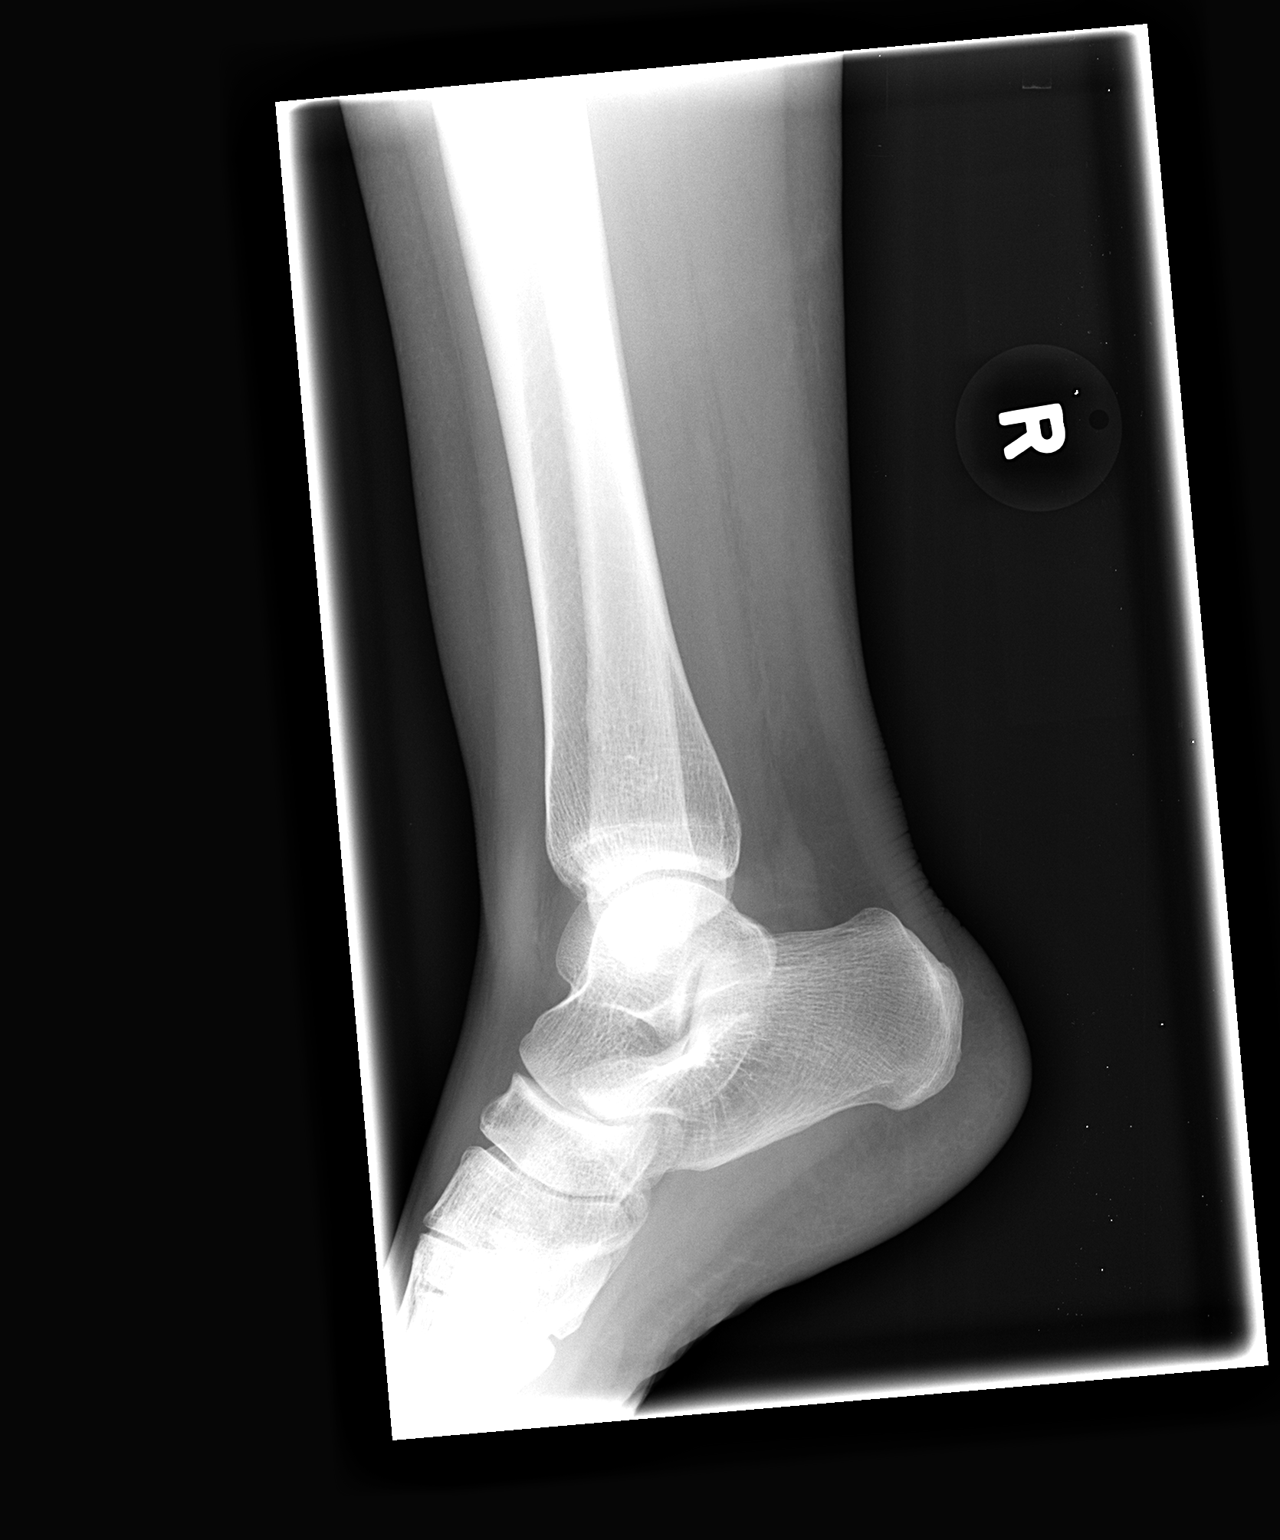

[3 of 3 positions shown; findings below may reference images not displayed]

FINDINGS: Three views of the right ankle submitted.  No acute
fracture or subluxation.  Ankle mortise is preserved.  No soft
tissue swelling.
IMPRESSION: No acute fracture or subluxation.
